# Patient Record
Sex: Female | Born: 1935
Health system: Southern US, Community
[De-identification: ages and names within clinical notes are randomized; demographics above are authoritative.]

## PROBLEM LIST (undated history)

## (undated) DIAGNOSIS — F0280 Dementia in other diseases classified elsewhere without behavioral disturbance: Secondary | ICD-10-CM

## (undated) DIAGNOSIS — F0390 Unspecified dementia without behavioral disturbance: Secondary | ICD-10-CM

## (undated) HISTORY — DX: Dementia in other diseases classified elsewhere, unspecified severity, without behavioral disturbance, psychotic disturbance, mood disturbance, and anxiety: F02.80

---

## 2012-06-15 ENCOUNTER — Encounter (HOSPITAL_COMMUNITY): Payer: Self-pay | Admitting: *Deleted

## 2012-06-15 ENCOUNTER — Emergency Department (HOSPITAL_COMMUNITY)
Admission: EM | Admit: 2012-06-15 | Discharge: 2012-06-15 | Disposition: A | Discharge status: Home / Self Care | Payer: Medicare Other | Attending: Emergency Medicine | Admitting: Emergency Medicine

## 2012-06-15 ENCOUNTER — Emergency Department (HOSPITAL_COMMUNITY): Payer: Medicare Other

## 2012-06-15 DIAGNOSIS — R4182 Altered mental status, unspecified: Secondary | ICD-10-CM | POA: Insufficient documentation

## 2012-06-15 DIAGNOSIS — F068 Other specified mental disorders due to known physiological condition: Secondary | ICD-10-CM | POA: Insufficient documentation

## 2012-06-15 DIAGNOSIS — R319 Hematuria, unspecified: Secondary | ICD-10-CM | POA: Insufficient documentation

## 2012-06-15 LAB — URINE MICROSCOPIC-ADD ON

## 2012-06-15 LAB — URINALYSIS, ROUTINE W REFLEX MICROSCOPIC
Bilirubin Urine: NEGATIVE
Specific Gravity, Urine: 1.02 (ref 1.005–1.030)
Urobilinogen, UA: 1 mg/dL (ref 0.0–1.0)

## 2012-06-15 LAB — COMPREHENSIVE METABOLIC PANEL
AST: 16 U/L (ref 0–37)
BUN: 15 mg/dL (ref 6–23)
CO2: 27 mEq/L (ref 19–32)
Calcium: 9.6 mg/dL (ref 8.4–10.5)
Creatinine, Ser: 1.05 mg/dL (ref 0.50–1.10)
GFR calc Af Amer: 59 mL/min — ABNORMAL LOW (ref >90)
GFR calc non Af Amer: 51 mL/min — ABNORMAL LOW (ref >90)
Glucose, Bld: 100 mg/dL — ABNORMAL HIGH (ref 70–99)

## 2012-06-15 LAB — CBC
MCH: 31.1 pg (ref 26.0–34.0)
MCV: 92.5 fL (ref 78.0–100.0)
Platelets: 248 10*3/uL (ref 150–400)
RBC: 4.56 MIL/uL (ref 3.87–5.11)

## 2012-06-15 NOTE — ED Notes (Signed)
Nuns cap given to caregiver to try to get urine sample from pt. nad

## 2012-06-15 NOTE — ED Notes (Signed)
POA brought pt in today, says she has not seen MD in 20 years. Anxious, dementia,  Husband in [facility].  Has been violent  At times. Calm at present.  They want something to "keep her calm"

## 2012-06-15 NOTE — ED Provider Notes (Signed)
History     CSN: [id number]  Arrival date & time 06/15/12  1452   First MD Initiated Contact with Patient 06/15/12 1536      Chief Complaint  Patient presents with  . Anxiety     HPI The patient presents with family members due to concerns of worsening dementia, increasingly agitated and violent behavior.  Notably, the patient's husband was recently hospitalized following a hip fracture and the patient is currently having her house extensively cleaned, do to her habit of hoarding. The patient denies any complaints and states that she has no physical illness, and defers interventions. Family member stated as the patient has expressed his recent stressful events she is become increasingly agitated, making threatening statements and lashing out at caregivers and family members.  History reviewed. No pertinent past medical history.  History reviewed. No pertinent past surgical history.  History reviewed. No pertinent family history.  History  Substance Use Topics  . Smoking status: Never Smoker   . Smokeless tobacco: Not on file  . Alcohol Use: No    OB History    Grav Para Term Preterm Abortions TAB SAB Ect Mult Living                  Review of Systems  Unable to perform ROS: Dementia    Allergies  Review of patient's allergies indicates no known allergies.  Home Medications   Current Outpatient Rx  Name Route Sig Dispense Refill  . ACETAMINOPHEN 325 MG PO TABS Oral Take 325 mg by mouth as needed. For occasional pain      BP 111/54  Pulse 103  Temp 98.8 F (37.1 C) (Oral)  Resp 20  Ht 5\' 2"  (1.575 m)  Wt 110 lb 11 oz (50.208 kg)  BMI 20.25 kg/m2  SpO2 100%  Physical Exam  Nursing note and vitals reviewed. Constitutional: She appears well-developed and well-nourished. No distress.  HENT:  Head: Normocephalic and atraumatic.  Eyes: Conjunctivae and EOM are normal.  Cardiovascular: Normal rate and regular rhythm.   Murmur heard. Pulmonary/Chest: Effort  normal and breath sounds normal. No stridor. No respiratory distress.  Abdominal: She exhibits no distension.  Musculoskeletal: She exhibits no edema.  Neurological: She is alert. No cranial nerve deficit.       Patient is oriented only to self, and grossly to place  Skin: Skin is warm and dry.  Psychiatric: She has a normal mood and affect. Her speech is normal and behavior is normal. Thought content is delusional. Cognition and memory are impaired. She exhibits abnormal recent memory and abnormal remote memory.    ED Course  Procedures (including critical care time)  Labs Reviewed  URINALYSIS, ROUTINE W REFLEX MICROSCOPIC - Abnormal; Notable for the following:    Hgb urine dipstick MODERATE (*)     All other components within normal limits  COMPREHENSIVE METABOLIC PANEL - Abnormal; Notable for the following:    Glucose, Bld 100 (*)     GFR calc non Af Amer 51 (*)     GFR calc Af Amer 59 (*)     All other components within normal limits  CBC  URINE MICROSCOPIC-ADD ON   Ct Head Wo Contrast  06/15/2012  *RADIOLOGY REPORT*  Clinical Data: Mental status change  CT HEAD WITHOUT CONTRAST  Technique:  Contiguous axial images were obtained from the base of the skull through the vertex without contrast.  Comparison: None.  Findings: Moderate atrophy.  Mild chronic microvascular ischemic changes.  No acute  infarct.  Negative for hemorrhage or mass.  No fluid collection.  Negative for skull lesion.  IMPRESSION: Atrophy and chronic microvascular ischemia.  No acute abnormality.  Original Report Authenticated By: Camelia Phenes, M.D.     1. Hematuria       MDM  This elderly female presents with family members due to concerns of increasingly agitated behavior and worsening dementia.  Given the description of new characteristics, the patient's inability to provide significant details, she had evaluation with labs, radiographic studies.  These were largely reassuring, though there is evidence of  hematuria.  The patient's family requested a medication for sedation, the patient defers all interventions, prescriptions.  I discussed the need for ongoing evaluation and the necessity of following up with a primary care physician to obtain her pending urine culture.  This will also facilitate further consideration of her dementia.  Absent notable physical exam findings, remarkably abnormal vital signs, the patient was appropriate for discharge, though with her family I discussed the need for close followup   Gerhard Munch, MD 06/15/12 1947

## 2012-06-17 LAB — URINE CULTURE

## 2012-08-23 ENCOUNTER — Emergency Department (HOSPITAL_COMMUNITY)
Admission: EM | Admit: 2012-08-23 | Discharge: 2012-08-23 | Disposition: A | Discharge status: Home / Self Care | Payer: Medicare Other | Attending: Emergency Medicine | Admitting: Emergency Medicine

## 2012-08-23 ENCOUNTER — Encounter (HOSPITAL_COMMUNITY): Payer: Self-pay

## 2012-08-23 DIAGNOSIS — F0390 Unspecified dementia without behavioral disturbance: Secondary | ICD-10-CM | POA: Insufficient documentation

## 2012-08-23 DIAGNOSIS — N390 Urinary tract infection, site not specified: Secondary | ICD-10-CM | POA: Insufficient documentation

## 2012-08-23 DIAGNOSIS — Z79899 Other long term (current) drug therapy: Secondary | ICD-10-CM | POA: Insufficient documentation

## 2012-08-23 HISTORY — DX: Unspecified dementia, unspecified severity, without behavioral disturbance, psychotic disturbance, mood disturbance, and anxiety: F03.90

## 2012-08-23 LAB — URINALYSIS, ROUTINE W REFLEX MICROSCOPIC
Glucose, UA: NEGATIVE mg/dL
Ketones, ur: NEGATIVE mg/dL
Nitrite: NEGATIVE
Specific Gravity, Urine: >1.03 — ABNORMAL HIGH (ref 1.005–1.030)
pH: 6 (ref 5.0–8.0)

## 2012-08-23 LAB — CBC WITH DIFFERENTIAL/PLATELET
Basophils Absolute: 0 10*3/uL (ref 0.0–0.1)
HCT: 45.9 % (ref 36.0–46.0)
Hemoglobin: 15.2 g/dL — ABNORMAL HIGH (ref 12.0–15.0)
Lymphocytes Relative: 16 % (ref 12–46)
Monocytes Absolute: 0.6 10*3/uL (ref 0.1–1.0)
Monocytes Relative: 8 % (ref 3–12)
Neutro Abs: 5.6 10*3/uL (ref 1.7–7.7)
Neutrophils Relative %: 74 % (ref 43–77)
RBC: 4.87 MIL/uL (ref 3.87–5.11)
WBC: 7.6 10*3/uL (ref 4.0–10.5)

## 2012-08-23 LAB — BASIC METABOLIC PANEL
BUN: 21 mg/dL (ref 6–23)
CO2: 29 mEq/L (ref 19–32)
Chloride: 105 mEq/L (ref 96–112)
Creatinine, Ser: 0.95 mg/dL (ref 0.50–1.10)
Potassium: 3.9 mEq/L (ref 3.5–5.1)

## 2012-08-23 LAB — URINE MICROSCOPIC-ADD ON

## 2012-08-23 MED ORDER — CEPHALEXIN 250 MG/5ML PO SUSR: 500.0000 mg | Freq: Once | ORAL | Status: DC

## 2012-08-23 MED ORDER — CEPHALEXIN 500 MG PO CAPS: 500.0000 mg | ORAL_CAPSULE | Freq: Four times a day (QID) | ORAL | Status: DC

## 2012-08-23 MED ORDER — CEPHALEXIN 500 MG PO CAPS
500.0000 mg | ORAL_CAPSULE | Freq: Once | ORAL | Status: AC
  Administered 2012-08-23: 500 mg via ORAL
  Filled 2012-08-23: qty 1

## 2012-08-23 MED ORDER — ONDANSETRON 4 MG PO TBDP
4.0000 mg | ORAL_TABLET | Freq: Once | ORAL | Status: AC
  Administered 2012-08-23: 4 mg via ORAL
  Filled 2012-08-23: qty 1

## 2012-08-23 NOTE — ED Notes (Signed)
Caregiver reports that pt has not been eating or drinking well for last 4 days. Afraid she may be getting dehydrated.

## 2012-08-23 NOTE — ED Notes (Signed)
EKG delayed due to dr. In room with patient.

## 2012-08-23 NOTE — ED Notes (Signed)
MD at bedside. Dr Bebe Shaggy at bedside discussing plan of care with caregiver and said pt.

## 2012-08-23 NOTE — ED Notes (Signed)
Pt given sprite and graham crackers. Pt tolerated both well. Care giver states the pt use to drink 4 pots of coffee a day and spouse recently (within past week) stopped all caffeine intake. Pt is quiet, and does not understand why she is here.

## 2012-08-23 NOTE — ED Provider Notes (Signed)
History   This chart was scribed for Joya Gaskins, MD by Charolett Bumpers . The patient was seen in room APA05/APA05. Patient's care was started at 1025.   CSN: [id number] Arrival date & time 08/23/12  1001  First MD Initiated Contact with Patient 08/23/12 1025      Chief Complaint  Patient presents with  . Failure To Thrive   Level 5 Caveat: Dementia The history is provided by the patient and a caregiver. No language interpreter was used.   Heather Cline is a 76 y.o. female who presents to the Emergency Department complaining of constant, moderate decreased appetite for the past 4 days. Caregiver denies any acute changes in mental status but states the pt has been more aggressive than usual. Caregiver denies any fevers, SOB or severe cough. Caregiver denies any recent falls or injuries. Pt denies any headache, vomiting, abdominal pain, chest pain, SOB. Pt has a h/o dementia and only takes Aricept. Caretaker states the pt is in the last stages of dementia. Hx is limited due to h/o dementia.    PCP: Dr. Margo Aye  Past Medical History  Diagnosis Date  . Dementia     History reviewed. No pertinent past surgical history.  No family history on file.  History  Substance Use Topics  . Smoking status: Never Smoker   . Smokeless tobacco: Not on file  . Alcohol Use: No    OB History    Grav Para Term Preterm Abortions TAB SAB Ect Mult Living                  Review of Systems  Unable to perform ROS: Dementia    Allergies  Review of patient's allergies indicates no known allergies.  Home Medications   Current Outpatient Rx  Name Route Sig Dispense Refill  . DONEPEZIL HCL 10 MG PO TABS Oral Take 10 mg by mouth at bedtime.    . CEPHALEXIN 500 MG PO CAPS Oral Take 1 capsule (500 mg total) by mouth 4 (four) times daily. 28 capsule 0    BP 113/40  Pulse 68  Temp 97.9 F (36.6 C) (Oral)  Resp 18  SpO2 98%  Physical Exam CONSTITUTIONAL: Well developed/well  nourished HEAD AND FACE: Normocephalic/atraumatic, no signs of trauma EYES: EOMI/PERRL ENMT: Mucous membranes moist NECK: supple no meningeal signs SPINE:entire spine nontender CV: S1/S2 noted, no murmurs/rubs/gallops noted LUNGS: Lungs are clear to auscultation bilaterally, no apparent distress ABDOMEN: soft, nontender, no rebound or guarding GU:no cva tenderness NEURO: Pt is awake/alert, moves all extremitiesx4, no arm or leg drift, gait normal, pt is awake and alert but unable to recall current date or time which is baseline.  EXTREMITIES: pulses normal, full ROM SKIN: warm, color normal PSYCH: no abnormalities of mood noted  ED Course  Procedures  DIAGNOSTIC STUDIES: Oxygen Saturation is 98% on room air, normal by my interpretation.    COORDINATION OF CARE:  10:43-Discussed planned course of treatment with the caregiver including nausea medication, blood work and UA, who is agreeable at this time.   10:45-Medication Orders: Ondansetron (Zofran-ODT) disintegrating tablet 4 mg-once.   12:05-Recheck: Pt appears to have a UTI. Will start on Keflex. Waiting for additional labs. Caregiver agreeable.   Labs Reviewed  CBC WITH DIFFERENTIAL - Abnormal; Notable for the following:    Hemoglobin 15.2 (*)     All other components within normal limits  BASIC METABOLIC PANEL - Abnormal; Notable for the following:    Glucose, Bld 103 (*)  GFR calc non Af Amer 58 (*)     GFR calc Af Amer 67 (*)     All other components within normal limits  URINALYSIS, ROUTINE W REFLEX MICROSCOPIC - Abnormal; Notable for the following:    Specific Gravity, Urine >1.030 (*)     Hgb urine dipstick LARGE (*)     Leukocytes, UA SMALL (*)     All other components within normal limits  URINE MICROSCOPIC-ADD ON - Abnormal; Notable for the following:    Bacteria, UA FEW (*)     All other components within normal limits  URINE CULTURE     MDM  Nursing notes including past medical history and social  history reviewed and considered in documentation Labs/vital reviewed and considered      Date: 08/23/2012  Rate: 79  Rhythm: normal sinus rhythm  QRS Axis: normal  Intervals: normal  ST/T Wave abnormalities: nonspecific ST changes  Conduction Disutrbances:none  Narrative Interpretation:   Old EKG Reviewed: none available at time of interpretation    I personally performed the services described in this documentation, which was scribed in my presence. The recorded information has been reviewed and considered.         Joya Gaskins, MD 08/23/12 1245

## 2012-08-23 NOTE — ED Notes (Signed)
Upon entering the room, I noticed a frail, calm and slightly withdrawn elderly lady sitting upright on the stretcher. A very tired, much younger caregiver at her bedside. When asked "what brings you here today" Pt stated she is eating and drinking as normal and does not understand why she was made to come here. Caregiver reports she has not been eating and drinking for the last 4 days. Pt does not appear dehydrated, oral mucus membranes are moist and pink. Pt 's history is to include dementia. NAD noted.

## 2012-08-24 LAB — URINE CULTURE: Colony Count: NO GROWTH

## 2015-12-04 DIAGNOSIS — E46 Unspecified protein-calorie malnutrition: Secondary | ICD-10-CM | POA: Diagnosis not present

## 2015-12-04 DIAGNOSIS — G309 Alzheimer's disease, unspecified: Secondary | ICD-10-CM | POA: Diagnosis not present

## 2015-12-04 DIAGNOSIS — R634 Abnormal weight loss: Secondary | ICD-10-CM | POA: Diagnosis not present

## 2015-12-25 DIAGNOSIS — R001 Bradycardia, unspecified: Secondary | ICD-10-CM | POA: Diagnosis not present

## 2015-12-25 DIAGNOSIS — F0280 Dementia in other diseases classified elsewhere without behavioral disturbance: Secondary | ICD-10-CM | POA: Diagnosis not present

## 2015-12-25 DIAGNOSIS — R634 Abnormal weight loss: Secondary | ICD-10-CM | POA: Diagnosis not present

## 2016-01-17 DIAGNOSIS — G309 Alzheimer's disease, unspecified: Secondary | ICD-10-CM | POA: Diagnosis not present

## 2016-01-29 DIAGNOSIS — I499 Cardiac arrhythmia, unspecified: Secondary | ICD-10-CM | POA: Diagnosis not present

## 2016-03-06 DIAGNOSIS — I739 Peripheral vascular disease, unspecified: Secondary | ICD-10-CM | POA: Diagnosis not present

## 2016-03-06 DIAGNOSIS — B351 Tinea unguium: Secondary | ICD-10-CM | POA: Diagnosis not present

## 2016-05-14 DIAGNOSIS — L0291 Cutaneous abscess, unspecified: Secondary | ICD-10-CM | POA: Diagnosis not present

## 2016-05-14 DIAGNOSIS — L0390 Cellulitis, unspecified: Secondary | ICD-10-CM | POA: Diagnosis not present

## 2016-11-12 DIAGNOSIS — R197 Diarrhea, unspecified: Secondary | ICD-10-CM | POA: Diagnosis not present

## 2016-11-12 DIAGNOSIS — R300 Dysuria: Secondary | ICD-10-CM | POA: Diagnosis not present

## 2016-11-13 DIAGNOSIS — N390 Urinary tract infection, site not specified: Secondary | ICD-10-CM | POA: Diagnosis not present

## 2017-04-17 ENCOUNTER — Encounter (HOSPITAL_COMMUNITY): Payer: Self-pay

## 2017-04-17 ENCOUNTER — Inpatient Hospital Stay (HOSPITAL_COMMUNITY)
Admission: EM | Admit: 2017-04-17 | Discharge: 2017-04-23 | DRG: 482 | Disposition: A | Discharge status: Home Health | Payer: Medicare Other | Attending: Internal Medicine | Admitting: Internal Medicine

## 2017-04-17 ENCOUNTER — Emergency Department (HOSPITAL_COMMUNITY): Payer: Medicare Other

## 2017-04-17 DIAGNOSIS — I480 Paroxysmal atrial fibrillation: Secondary | ICD-10-CM | POA: Diagnosis not present

## 2017-04-17 DIAGNOSIS — S299XXA Unspecified injury of thorax, initial encounter: Secondary | ICD-10-CM | POA: Diagnosis not present

## 2017-04-17 DIAGNOSIS — D638 Anemia in other chronic diseases classified elsewhere: Secondary | ICD-10-CM | POA: Diagnosis not present

## 2017-04-17 DIAGNOSIS — Z79899 Other long term (current) drug therapy: Secondary | ICD-10-CM | POA: Diagnosis not present

## 2017-04-17 DIAGNOSIS — E876 Hypokalemia: Secondary | ICD-10-CM

## 2017-04-17 DIAGNOSIS — Z23 Encounter for immunization: Secondary | ICD-10-CM | POA: Diagnosis not present

## 2017-04-17 DIAGNOSIS — W19XXXA Unspecified fall, initial encounter: Secondary | ICD-10-CM | POA: Diagnosis not present

## 2017-04-17 DIAGNOSIS — S72142D Displaced intertrochanteric fracture of left femur, subsequent encounter for closed fracture with routine healing: Secondary | ICD-10-CM | POA: Diagnosis not present

## 2017-04-17 DIAGNOSIS — K5641 Fecal impaction: Secondary | ICD-10-CM | POA: Diagnosis not present

## 2017-04-17 DIAGNOSIS — R197 Diarrhea, unspecified: Secondary | ICD-10-CM | POA: Diagnosis not present

## 2017-04-17 DIAGNOSIS — Z043 Encounter for examination and observation following other accident: Secondary | ICD-10-CM | POA: Diagnosis not present

## 2017-04-17 DIAGNOSIS — S72009A Fracture of unspecified part of neck of unspecified femur, initial encounter for closed fracture: Secondary | ICD-10-CM | POA: Diagnosis present

## 2017-04-17 DIAGNOSIS — N390 Urinary tract infection, site not specified: Secondary | ICD-10-CM | POA: Diagnosis not present

## 2017-04-17 DIAGNOSIS — N183 Chronic kidney disease, stage 3 unspecified: Secondary | ICD-10-CM | POA: Diagnosis present

## 2017-04-17 DIAGNOSIS — F0280 Dementia in other diseases classified elsewhere without behavioral disturbance: Secondary | ICD-10-CM | POA: Diagnosis present

## 2017-04-17 DIAGNOSIS — R109 Unspecified abdominal pain: Secondary | ICD-10-CM | POA: Diagnosis not present

## 2017-04-17 DIAGNOSIS — F03C0 Unspecified dementia, severe, without behavioral disturbance, psychotic disturbance, mood disturbance, and anxiety: Secondary | ICD-10-CM | POA: Diagnosis present

## 2017-04-17 DIAGNOSIS — S72142A Displaced intertrochanteric fracture of left femur, initial encounter for closed fracture: Secondary | ICD-10-CM | POA: Diagnosis not present

## 2017-04-17 DIAGNOSIS — J9811 Atelectasis: Secondary | ICD-10-CM | POA: Diagnosis not present

## 2017-04-17 DIAGNOSIS — M25552 Pain in left hip: Secondary | ICD-10-CM | POA: Diagnosis not present

## 2017-04-17 DIAGNOSIS — F0390 Unspecified dementia without behavioral disturbance: Secondary | ICD-10-CM | POA: Diagnosis not present

## 2017-04-17 DIAGNOSIS — Y92009 Unspecified place in unspecified non-institutional (private) residence as the place of occurrence of the external cause: Secondary | ICD-10-CM | POA: Diagnosis not present

## 2017-04-17 DIAGNOSIS — S72002A Fracture of unspecified part of neck of left femur, initial encounter for closed fracture: Secondary | ICD-10-CM

## 2017-04-17 DIAGNOSIS — W1830XA Fall on same level, unspecified, initial encounter: Secondary | ICD-10-CM | POA: Diagnosis not present

## 2017-04-17 DIAGNOSIS — R509 Fever, unspecified: Secondary | ICD-10-CM

## 2017-04-17 DIAGNOSIS — T148XXA Other injury of unspecified body region, initial encounter: Secondary | ICD-10-CM | POA: Diagnosis not present

## 2017-04-17 DIAGNOSIS — R279 Unspecified lack of coordination: Secondary | ICD-10-CM | POA: Diagnosis not present

## 2017-04-17 DIAGNOSIS — K5900 Constipation, unspecified: Secondary | ICD-10-CM | POA: Diagnosis not present

## 2017-04-17 DIAGNOSIS — D72829 Elevated white blood cell count, unspecified: Secondary | ICD-10-CM | POA: Diagnosis not present

## 2017-04-17 DIAGNOSIS — Z66 Do not resuscitate: Secondary | ICD-10-CM | POA: Diagnosis not present

## 2017-04-17 DIAGNOSIS — R8281 Pyuria: Secondary | ICD-10-CM | POA: Diagnosis present

## 2017-04-17 DIAGNOSIS — I491 Atrial premature depolarization: Secondary | ICD-10-CM | POA: Diagnosis not present

## 2017-04-17 DIAGNOSIS — R404 Transient alteration of awareness: Secondary | ICD-10-CM | POA: Diagnosis not present

## 2017-04-17 DIAGNOSIS — Z7401 Bed confinement status: Secondary | ICD-10-CM | POA: Diagnosis not present

## 2017-04-17 LAB — BASIC METABOLIC PANEL
Anion gap: 9 (ref 5–15)
BUN: 23 mg/dL — AB (ref 6–20)
CALCIUM: 9.4 mg/dL (ref 8.9–10.3)
CO2: 26 mmol/L (ref 22–32)
CREATININE: 1.28 mg/dL — AB (ref 0.44–1.00)
Chloride: 107 mmol/L (ref 101–111)
GFR calc Af Amer: 45 mL/min — ABNORMAL LOW (ref >60)
GFR, EST NON AFRICAN AMERICAN: 39 mL/min — AB (ref >60)
Glucose, Bld: 133 mg/dL — ABNORMAL HIGH (ref 65–99)
Potassium: 3.8 mmol/L (ref 3.5–5.1)
Sodium: 142 mmol/L (ref 135–145)

## 2017-04-17 LAB — CBC WITH DIFFERENTIAL/PLATELET
BASOS ABS: 0 10*3/uL (ref 0.0–0.1)
BASOS PCT: 0 %
EOS ABS: 0.1 10*3/uL (ref 0.0–0.7)
EOS PCT: 0 %
HCT: 44.7 % (ref 36.0–46.0)
Hemoglobin: 14.7 g/dL (ref 12.0–15.0)
Lymphocytes Relative: 16 %
Lymphs Abs: 1.8 10*3/uL (ref 0.7–4.0)
MCH: 30.7 pg (ref 26.0–34.0)
MCHC: 32.9 g/dL (ref 30.0–36.0)
MCV: 93.3 fL (ref 78.0–100.0)
Monocytes Absolute: 0.8 10*3/uL (ref 0.1–1.0)
Monocytes Relative: 7 %
Neutro Abs: 8.8 10*3/uL — ABNORMAL HIGH (ref 1.7–7.7)
Neutrophils Relative %: 77 %
PLATELETS: 294 10*3/uL (ref 150–400)
RBC: 4.79 MIL/uL (ref 3.87–5.11)
RDW: 13.5 % (ref 11.5–15.5)
WBC: 11.4 10*3/uL — AB (ref 4.0–10.5)

## 2017-04-17 LAB — ABO/RH: ABO/RH(D): A POS

## 2017-04-17 LAB — URINALYSIS, ROUTINE W REFLEX MICROSCOPIC
Bilirubin Urine: NEGATIVE
GLUCOSE, UA: NEGATIVE mg/dL
Hgb urine dipstick: NEGATIVE
Ketones, ur: NEGATIVE mg/dL
Nitrite: NEGATIVE
PROTEIN: NEGATIVE mg/dL
Specific Gravity, Urine: 1.019 (ref 1.005–1.030)
pH: 7 (ref 5.0–8.0)

## 2017-04-17 LAB — PROTIME-INR
INR: 1
PROTHROMBIN TIME: 13.2 s (ref 11.4–15.2)

## 2017-04-17 LAB — PREPARE RBC (CROSSMATCH)

## 2017-04-17 MED ORDER — SODIUM CHLORIDE 0.9 % IV SOLN: INTRAVENOUS | Status: DC

## 2017-04-17 MED ORDER — POVIDONE-IODINE 10 % EX SWAB: 2.0000 "application " | Freq: Once | CUTANEOUS | Status: DC

## 2017-04-17 MED ORDER — SODIUM CHLORIDE 0.9 % IV SOLN
Freq: Once | INTRAVENOUS | Status: AC
  Administered 2017-04-18: 12:00:00 via INTRAVENOUS

## 2017-04-17 MED ORDER — SODIUM CHLORIDE 0.45 % IV SOLN
INTRAVENOUS | Status: DC
  Administered 2017-04-18 – 2017-04-22 (×9): via INTRAVENOUS

## 2017-04-17 MED ORDER — CEFAZOLIN SODIUM-DEXTROSE 2-4 GM/100ML-% IV SOLN
2.0000 g | INTRAVENOUS | Status: AC
  Administered 2017-04-18: 2 g via INTRAVENOUS
  Filled 2017-04-17 (×2): qty 100

## 2017-04-17 MED ORDER — CHLORHEXIDINE GLUCONATE 4 % EX LIQD
60.0000 mL | Freq: Once | CUTANEOUS | Status: AC
  Administered 2017-04-18: 4 via TOPICAL
  Filled 2017-04-17: qty 60

## 2017-04-17 MED ORDER — DEXTROSE 5 % IV SOLN
1.0000 g | INTRAVENOUS | Status: DC
  Administered 2017-04-18: 1 g via INTRAVENOUS
  Filled 2017-04-17 (×4): qty 10

## 2017-04-17 MED ORDER — MAGNESIUM HYDROXIDE 400 MG/5ML PO SUSP: 30.0000 mL | Freq: Every day | ORAL | Status: DC | PRN

## 2017-04-17 MED ORDER — DONEPEZIL HCL 5 MG PO TABS
10.0000 mg | ORAL_TABLET | Freq: Every day | ORAL | Status: DC
  Administered 2017-04-18 – 2017-04-21 (×4): 10 mg via ORAL
  Filled 2017-04-17 (×5): qty 2

## 2017-04-17 MED ORDER — ENOXAPARIN SODIUM 40 MG/0.4ML ~~LOC~~ SOLN: 40.0000 mg | SUBCUTANEOUS | Status: DC

## 2017-04-17 MED ORDER — MORPHINE SULFATE (PF) 2 MG/ML IV SOLN
1.0000 mg | INTRAVENOUS | Status: DC | PRN
  Administered 2017-04-18 – 2017-04-19 (×3): 1 mg via INTRAVENOUS
  Filled 2017-04-17 (×3): qty 1

## 2017-04-17 MED ORDER — DOCUSATE SODIUM 100 MG PO CAPS
100.0000 mg | ORAL_CAPSULE | Freq: Two times a day (BID) | ORAL | Status: DC
  Administered 2017-04-18 – 2017-04-21 (×4): 100 mg via ORAL
  Filled 2017-04-17 (×6): qty 1

## 2017-04-17 MED ORDER — HYDROCODONE-ACETAMINOPHEN 5-325 MG PO TABS
1.0000 | ORAL_TABLET | Freq: Four times a day (QID) | ORAL | Status: DC | PRN
  Administered 2017-04-18 – 2017-04-19 (×2): 1 via ORAL
  Filled 2017-04-17 (×2): qty 1

## 2017-04-17 MED ORDER — BISACODYL 10 MG RE SUPP: 10.0000 mg | Freq: Every day | RECTAL | Status: DC | PRN

## 2017-04-17 NOTE — ED Notes (Signed)
MD at bedside. 

## 2017-04-17 NOTE — ED Notes (Signed)
Patient transported to CT 

## 2017-04-17 NOTE — ED Triage Notes (Addendum)
Pt brought in by [facility] for c/o fall; family witnessed pt fall on her left side; family assisted pt into wheelchair;  pt has a hx of alzheimers and is unable to verbalize her pain; pt has shortening and rotation to left hip; pt given morphine 4mg  IV en route to hospital

## 2017-04-17 NOTE — H&P (Addendum)
[facility] History and Physical  AHMIRA BOISSELLE YYQ:[id number] DOB: 1936/05/09 DOA: 04/17/2017  Referring physician: Dr Vanita Panda PCP: Celene Squibb, MD   Chief Complaint: Lytle Michaels / L hip fracture  HPI: Heather Cline is a 81 y.o. female with history of severe dementia but no other medical conditions, takes only Aricept.  Pt fell at home today , in ED xrays show L intertrochanteric fracture of the left femur.  ED spoke with ortho and they plan on doing surgery here tomorrow.  We are asked to admit pt.    Hx per husband and pt's sister.  She has had progressive severe dementia > 5 yrs now.  Lives with husband, requires assistance with all ADL's, will not eat much unless attended to.  Is fully ambulatory around the house, but does have a tendency to wander.  No cane/ walker or WC needed.    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  No etoh / tob. They have one son in the [employer] and one daughter.    ROS  not available, pt not responding, severe dementia   Past Medical History  Past Medical History:  Diagnosis Date  . Dementia    Past Surgical History No past surgical history on file. Family History No family history on file. Social History  reports that she has never smoked. She does not have any smokeless tobacco history on file. She reports that she does not drink alcohol or use drugs. Allergies No Known Allergies Home medications Prior to Admission medications   Medication Sig Start Date End Date Taking? Authorizing Provider  donepezil (ARICEPT) 10 MG tablet Take 10 mg by mouth at bedtime.   Yes [provider]   Liver Function Tests No results for input(s): AST, ALT, ALKPHOS, BILITOT, PROT, ALBUMIN in the last 168 hours. No results for input(s): LIPASE, AMYLASE in the last 168 hours. CBC  Recent Labs Lab 04/17/17 2037  WBC 11.4*  NEUTROABS 8.8*  HGB 14.7  HCT 44.7  MCV 93.3  PLT 888   Basic Metabolic  Panel  Recent Labs Lab 04/17/17 2041  NA 142  K 3.8  CL 107  CO2 26  GLUCOSE 133*  BUN 23*  CREATININE 1.28*  CALCIUM 9.4     Vitals:   04/17/17 2012 04/17/17 2100 04/17/17 2130  BP: (!) 152/67 (!) 159/68 (!) 145/67  Pulse: 60 (!) 56 (!) 50  Resp: _0 Temp: 98.7 F (37.1 C)    TempSrc: Oral    SpO2: 100% 100% 99%   Exam: Gen elderly , quiet WF, pleasant, confused  No rash, cyanosis or gangrene Sclera anicteric, throat clear  No jvd or bruits Chest clear bilat RRR no MRG Abd soft ntnd no mass or ascites +bs, small periumb hernia GU foley in place, clear urine MS no joint effusion, L leg shortened and ext rotated Ext no LE edema / no wounds or ulcers Neuro is groggy, awakens easily, doesn't follow commands, not oriented    Na 142  K 3.8  BUN 23  Cr 1.28  eGFR  40   Ca 9.4 WBC 11  Hb 14  UA cloudy, few bact, TNTC wbc, 6-30 RBC's, 0-5 epi's.   CXR (independ reviewed) > no acute disease   Assessment: 1  Mechanical fall/  L hip fracture - admit, NPO after MN, ortho aware and plan to see pt in am 2  Pyuria - IV rocephin and urine  cx 3  Severe dementia - cont Aricept 4  CKD stage 3 5  Fecal impaction - picked up on xrays of hip  Plan - admit, NPO after MN, IVF's, MSO4 prn, bed alarms due to dementia, stool softeners and prn laxatives ordered for post-op     Ketura Sirek D [facility] Pager [phone]   If 7PM-7AM, please contact night-coverage [email or web address] Password [email or web address] 04/17/2017, 10:14 PM

## 2017-04-17 NOTE — ED Provider Notes (Signed)
[facility]-EMERGENCY DEPT Provider Note   CSN: [id number] Arrival date & time: 04/17/17  2009     History   Chief Complaint Chief Complaint  Patient presents with  . Fall    HPI Sudie Baileyhyllis T Colmenares is a 81 y.o. female.  HPI This elderly female with dementia presents from home after a fall. Falls witnessed by family members. The patient has dementia severe enough that she does not answer questions consistently, mumbles majority of the time, and is incapable of providing details of the history of present illness,   level V caveat.  Per report the patient was in her usual state of health, when she had a witnessed mechanical fall onto her left side. Since that time she has been nonambulatory.     Past Medical History:  Diagnosis Date  . Dementia     There are no active problems to display for this patient.   No past surgical history on file.  OB History    No data available       Home Medications    Prior to Admission medications   Medication Sig Start Date End Date Taking? Authorizing Provider  cephALEXin (KEFLEX) 500 MG capsule Take 1 capsule (500 mg total) by mouth 4 (four) times daily. 08/23/12   Zadie RhineWickline, Donald, MD  donepezil (ARICEPT) 10 MG tablet Take 10 mg by mouth at bedtime.    [provider]    Family History No family history on file.  Social History Social History  Substance Use Topics  . Smoking status: Never Smoker  . Smokeless tobacco: Not on file  . Alcohol use No     Allergies   Patient has no known allergies.   Review of Systems Review of Systems  Unable to perform ROS: Dementia     Physical Exam Updated Vital Signs BP (!) 152/67 (BP Location: Right Arm)   Pulse 60   Temp 98.7 F (37.1 C) (Oral)   Resp 18   SpO2 100%   Physical Exam  Constitutional: No distress.  Frail appearing elderly female offering inconsistent verbal responses  HENT:  Head: Normocephalic and atraumatic.  Eyes: Conjunctivae and EOM are  normal.  Cardiovascular: Normal rate and regular rhythm.   Pulmonary/Chest: Effort normal and breath sounds normal. No stridor. No respiratory distress.  Abdominal: She exhibits no distension.  Musculoskeletal: She exhibits no edema.       Left hip: She exhibits deformity.       Legs: Neurological: She is alert. She is disoriented. She displays atrophy. No cranial nerve deficit. She displays no seizure activity.  Skin: Skin is warm and dry.  Psychiatric: Cognition and memory are impaired.  Nursing note and vitals reviewed.    ED Treatments / Results  Labs (all labs ordered are listed, but only abnormal results are displayed) Labs Reviewed  BASIC METABOLIC PANEL - Abnormal; Notable for the following:       Result Value   Glucose, Bld 133 (*)    BUN 23 (*)    Creatinine, Ser 1.28 (*)    GFR calc non Af Amer 39 (*)    GFR calc Af Amer 45 (*)    All other components within normal limits  CBC WITH DIFFERENTIAL/PLATELET - Abnormal; Notable for the following:    WBC 11.4 (*)    Neutro Abs 8.8 (*)    All other components within normal limits  PROTIME-INR  URINALYSIS, ROUTINE W REFLEX MICROSCOPIC  TYPE AND SCREEN     Radiology I reviewed  the radiographic images, including left femoral neck hip fracture. Chest x-ray unremarkable to my interpretation.  Procedures Procedures (including critical care time)  Medications Ordered in ED Medications - No data to display   Initial Impression / Assessment and Plan / ED Course  I have reviewed the triage vital signs and the nursing notes.  Pertinent labs & imaging results that were available during my care of the patient were reviewed by me and considered in my medical decision making (see chart for details).  Update: Family members now arrived, they stated that the patient had a witnessed fall onto her left side, has been nonambulatory since the event. They confirm that the patient is otherwise generally well, has the unfortunate  paradox of being in generally good health aside from dementia. He states that the last time the patient was hospitalized was during childbirth. Patient is almost 81 years old. On repeat exam the patient remains in similar condition, pleasant, though demented. I reviewed the x-rays, labs, discussed the patient's case with our orthopedist on call, Dr. Romeo Apple. With concern for new hip fracture, the patient will be admitted to the hospitalist team.  Final Clinical Impressions(s) / ED Diagnoses  Fall, initial encounter Left hip fracture, initial encounter   Gerhard Munch, MD 04/17/17 2128

## 2017-04-18 ENCOUNTER — Inpatient Hospital Stay (HOSPITAL_COMMUNITY): Payer: Medicare Other

## 2017-04-18 ENCOUNTER — Encounter (HOSPITAL_COMMUNITY)
Admission: EM | Disposition: A | Discharge status: Home Health | Payer: Self-pay | Source: Home / Self Care | Attending: Internal Medicine

## 2017-04-18 ENCOUNTER — Inpatient Hospital Stay (HOSPITAL_COMMUNITY): Payer: Medicare Other | Admitting: Anesthesiology

## 2017-04-18 ENCOUNTER — Encounter (HOSPITAL_COMMUNITY): Payer: Self-pay | Admitting: Anesthesiology

## 2017-04-18 DIAGNOSIS — S72142A Displaced intertrochanteric fracture of left femur, initial encounter for closed fracture: Principal | ICD-10-CM

## 2017-04-18 HISTORY — PX: INTRAMEDULLARY (IM) NAIL INTERTROCHANTERIC: SHX5875

## 2017-04-18 LAB — CBC
HEMATOCRIT: 44 % (ref 36.0–46.0)
Hemoglobin: 14.5 g/dL (ref 12.0–15.0)
MCH: 30.7 pg (ref 26.0–34.0)
MCHC: 33 g/dL (ref 30.0–36.0)
MCV: 93 fL (ref 78.0–100.0)
Platelets: 269 10*3/uL (ref 150–400)
RBC: 4.73 MIL/uL (ref 3.87–5.11)
RDW: 13.3 % (ref 11.5–15.5)
WBC: 14.9 10*3/uL — AB (ref 4.0–10.5)

## 2017-04-18 LAB — SURGICAL PCR SCREEN
MRSA, PCR: POSITIVE — AB
Staphylococcus aureus: POSITIVE — AB

## 2017-04-18 LAB — BASIC METABOLIC PANEL
ANION GAP: 9 (ref 5–15)
BUN: 20 mg/dL (ref 6–20)
CHLORIDE: 106 mmol/L (ref 101–111)
CO2: 24 mmol/L (ref 22–32)
Calcium: 8.9 mg/dL (ref 8.9–10.3)
Creatinine, Ser: 0.96 mg/dL (ref 0.44–1.00)
GFR calc Af Amer: >60 mL/min (ref >60)
GFR calc non Af Amer: 55 mL/min — ABNORMAL LOW (ref >60)
GLUCOSE: 126 mg/dL — AB (ref 65–99)
POTASSIUM: 4 mmol/L (ref 3.5–5.1)
Sodium: 139 mmol/L (ref 135–145)

## 2017-04-18 SURGERY — FIXATION, FRACTURE, INTERTROCHANTERIC, WITH INTRAMEDULLARY ROD
Anesthesia: Spinal | Laterality: Left

## 2017-04-18 MED ORDER — LORAZEPAM 2 MG/ML IJ SOLN
0.5000 mg | INTRAMUSCULAR | Status: DC | PRN
  Administered 2017-04-18 – 2017-04-20 (×2): 1 mg via INTRAVENOUS
  Filled 2017-04-18 (×4): qty 1

## 2017-04-18 MED ORDER — BUPIVACAINE-EPINEPHRINE (PF) 0.5% -1:200000 IJ SOLN
INTRAMUSCULAR | Status: AC
  Filled 2017-04-18: qty 30

## 2017-04-18 MED ORDER — FENTANYL CITRATE (PF) 100 MCG/2ML IJ SOLN
INTRAMUSCULAR | Status: DC | PRN
  Administered 2017-04-18 (×2): 25 ug via INTRAVENOUS
  Administered 2017-04-18: 25 ug via INTRATHECAL
  Administered 2017-04-18: 25 ug via INTRAVENOUS

## 2017-04-18 MED ORDER — ENSURE ENLIVE PO LIQD
237.0000 mL | Freq: Two times a day (BID) | ORAL | Status: DC
  Administered 2017-04-20 – 2017-04-23 (×6): 237 mL via ORAL

## 2017-04-18 MED ORDER — MIDAZOLAM HCL 2 MG/2ML IJ SOLN
1.0000 mg | INTRAMUSCULAR | Status: DC
  Administered 2017-04-18: 1 mg via INTRAVENOUS

## 2017-04-18 MED ORDER — ACETAMINOPHEN 325 MG PO TABS: 650.0000 mg | ORAL_TABLET | Freq: Four times a day (QID) | ORAL | Status: DC | PRN

## 2017-04-18 MED ORDER — MIDAZOLAM HCL 2 MG/2ML IJ SOLN
INTRAMUSCULAR | Status: AC
  Filled 2017-04-18: qty 2

## 2017-04-18 MED ORDER — CHLORHEXIDINE GLUCONATE CLOTH 2 % EX PADS
6.0000 | MEDICATED_PAD | Freq: Every day | CUTANEOUS | Status: AC
  Administered 2017-04-19 – 2017-04-23 (×5): 6 via TOPICAL

## 2017-04-18 MED ORDER — MEPERIDINE HCL 50 MG/ML IJ SOLN
6.2500 mg | INTRAMUSCULAR | Status: DC | PRN
  Administered 2017-04-18: 6.25 mg via INTRAVENOUS

## 2017-04-18 MED ORDER — DEXTROSE 5 % IV SOLN
1.0000 g | INTRAVENOUS | Status: DC
  Administered 2017-04-19: 1 g via INTRAVENOUS
  Filled 2017-04-18 (×3): qty 10

## 2017-04-18 MED ORDER — BUPIVACAINE IN DEXTROSE 0.75-8.25 % IT SOLN
INTRATHECAL | Status: DC | PRN
  Administered 2017-04-18: 13 mg via INTRATHECAL

## 2017-04-18 MED ORDER — FENTANYL CITRATE (PF) 100 MCG/2ML IJ SOLN: 25.0000 ug | INTRAMUSCULAR | Status: DC | PRN

## 2017-04-18 MED ORDER — MIDAZOLAM HCL 5 MG/5ML IJ SOLN
INTRAMUSCULAR | Status: DC | PRN
  Administered 2017-04-18 (×2): 1 mg via INTRAVENOUS

## 2017-04-18 MED ORDER — MENTHOL 3 MG MT LOZG: 1.0000 | LOZENGE | OROMUCOSAL | Status: DC | PRN

## 2017-04-18 MED ORDER — LACTATED RINGERS IV SOLN: INTRAVENOUS | Status: DC

## 2017-04-18 MED ORDER — MAGNESIUM CITRATE PO SOLN
1.0000 | Freq: Once | ORAL | Status: AC
  Administered 2017-04-19: 1 via ORAL
  Filled 2017-04-18: qty 296

## 2017-04-18 MED ORDER — FENTANYL CITRATE (PF) 100 MCG/2ML IJ SOLN: 25.0000 ug | Freq: Once | INTRAMUSCULAR | Status: DC

## 2017-04-18 MED ORDER — PROPOFOL 500 MG/50ML IV EMUL
INTRAVENOUS | Status: DC | PRN
  Administered 2017-04-18: 50 ug/kg/min via INTRAVENOUS

## 2017-04-18 MED ORDER — ACETAMINOPHEN 650 MG RE SUPP: 650.0000 mg | Freq: Four times a day (QID) | RECTAL | Status: DC | PRN

## 2017-04-18 MED ORDER — CEFTRIAXONE SODIUM 1 G IJ SOLR
INTRAMUSCULAR | Status: AC
  Filled 2017-04-18: qty 10

## 2017-04-18 MED ORDER — MUPIROCIN 2 % EX OINT
1.0000 "application " | TOPICAL_OINTMENT | Freq: Two times a day (BID) | CUTANEOUS | Status: AC
  Administered 2017-04-18 – 2017-04-22 (×10): 1 via NASAL
  Filled 2017-04-18 (×2): qty 22

## 2017-04-18 MED ORDER — METOCLOPRAMIDE HCL 10 MG PO TABS: 5.0000 mg | ORAL_TABLET | Freq: Three times a day (TID) | ORAL | Status: DC | PRN

## 2017-04-18 MED ORDER — BUPIVACAINE-EPINEPHRINE (PF) 0.5% -1:200000 IJ SOLN
INTRAMUSCULAR | Status: AC
Start: 2017-04-18 — End: 2017-04-18
  Filled 2017-04-18: qty 30

## 2017-04-18 MED ORDER — PNEUMOCOCCAL VAC POLYVALENT 25 MCG/0.5ML IJ INJ
0.5000 mL | INJECTION | INTRAMUSCULAR | Status: AC
  Administered 2017-04-20: 0.5 mL via INTRAMUSCULAR
  Filled 2017-04-18: qty 0.5

## 2017-04-18 MED ORDER — SODIUM CHLORIDE 0.9 % IR SOLN
Status: DC | PRN
  Administered 2017-04-18: 1000 mL

## 2017-04-18 MED ORDER — MEPERIDINE HCL 50 MG/ML IJ SOLN
INTRAMUSCULAR | Status: AC
  Filled 2017-04-18: qty 1

## 2017-04-18 MED ORDER — BUPIVACAINE-EPINEPHRINE (PF) 0.5% -1:200000 IJ SOLN
INTRAMUSCULAR | Status: DC | PRN
  Administered 2017-04-18: 60 mL

## 2017-04-18 MED ORDER — METOCLOPRAMIDE HCL 5 MG/ML IJ SOLN: 5.0000 mg | Freq: Three times a day (TID) | INTRAMUSCULAR | Status: DC | PRN

## 2017-04-18 MED ORDER — ADULT MULTIVITAMIN W/MINERALS CH
1.0000 | ORAL_TABLET | Freq: Every day | ORAL | Status: DC
  Administered 2017-04-19 – 2017-04-23 (×5): 1 via ORAL
  Filled 2017-04-18 (×5): qty 1

## 2017-04-18 MED ORDER — ASPIRIN EC 325 MG PO TBEC
325.0000 mg | DELAYED_RELEASE_TABLET | Freq: Every day | ORAL | Status: DC
  Administered 2017-04-19 – 2017-04-23 (×5): 325 mg via ORAL
  Filled 2017-04-18 (×5): qty 1

## 2017-04-18 MED ORDER — PHENOL 1.4 % MT LIQD: 1.0000 | OROMUCOSAL | Status: DC | PRN

## 2017-04-18 SURGICAL SUPPLY — 55 items
BAG HAMPER (MISCELLANEOUS) ×3 IMPLANT
BIT DRILL AO GAMMA 4.2X300 (BIT) ×3 IMPLANT
BLADE HEX COATED 2.75 (ELECTRODE) ×3 IMPLANT
BLADE SURG SZ10 CARB STEEL (BLADE) ×6 IMPLANT
BNDG GAUZE ELAST 4 BULKY (GAUZE/BANDAGES/DRESSINGS) ×3 IMPLANT
CHLORAPREP W/TINT 26ML (MISCELLANEOUS) ×3 IMPLANT
CLOTH BEACON ORANGE TIMEOUT ST (SAFETY) ×3 IMPLANT
COVER LIGHT HANDLE STERIS (MISCELLANEOUS) ×6 IMPLANT
COVER MAYO STAND XLG (DRAPE) ×3 IMPLANT
DECANTER SPIKE VIAL GLASS SM (MISCELLANEOUS) ×6 IMPLANT
DRAPE STERI IOBAN 125X83 (DRAPES) ×3 IMPLANT
DRSG MEPILEX BORDER 4X12 (GAUZE/BANDAGES/DRESSINGS) ×3 IMPLANT
DRSG PAD ABDOMINAL 8X10 ST (GAUZE/BANDAGES/DRESSINGS) ×3 IMPLANT
ELECT REM PT RETURN 9FT ADLT (ELECTROSURGICAL) ×3
ELECTRODE REM PT RTRN 9FT ADLT (ELECTROSURGICAL) ×1 IMPLANT
GLOVE BIOGEL PI IND STRL 7.0 (GLOVE) ×2 IMPLANT
GLOVE BIOGEL PI INDICATOR 7.0 (GLOVE) ×4
GLOVE SKINSENSE NS SZ8.0 LF (GLOVE) ×2
GLOVE SKINSENSE STRL SZ8.0 LF (GLOVE) ×1 IMPLANT
GLOVE SS N UNI LF 8.5 STRL (GLOVE) ×3 IMPLANT
GOWN STRL REUS W/ TWL LRG LVL3 (GOWN DISPOSABLE) ×1 IMPLANT
GOWN STRL REUS W/TWL LRG LVL3 (GOWN DISPOSABLE) ×5 IMPLANT
GOWN STRL REUS W/TWL XL LVL3 (GOWN DISPOSABLE) ×3 IMPLANT
GUIDEROD T2 3X1000 (ROD) ×3 IMPLANT
INST SET MAJOR BONE (KITS) ×3 IMPLANT
K-WIRE  3.2X450M STR (WIRE) ×2
K-WIRE 3.2X450M STR (WIRE) ×1
KIT BLADEGUARD II DBL (SET/KITS/TRAYS/PACK) ×3 IMPLANT
KIT ROOM TURNOVER AP CYSTO (KITS) ×3 IMPLANT
KWIRE 3.2X450M STR (WIRE) ×1 IMPLANT
MANIFOLD NEPTUNE II (INSTRUMENTS) ×3 IMPLANT
MARKER SKIN DUAL TIP RULER LAB (MISCELLANEOUS) ×3 IMPLANT
NAIL TROCH GAMMA 11X18 (Nail) ×3 IMPLANT
NEEDLE HYPO 21X1.5 SAFETY (NEEDLE) ×3 IMPLANT
NEEDLE SPNL 18GX3.5 QUINCKE PK (NEEDLE) ×3 IMPLANT
NS IRRIG 1000ML POUR BTL (IV SOLUTION) ×3 IMPLANT
PACK BASIC III (CUSTOM PROCEDURE TRAY) ×2
PACK SRG BSC III STRL LF ECLPS (CUSTOM PROCEDURE TRAY) ×1 IMPLANT
PENCIL HANDSWITCHING (ELECTRODE) ×3 IMPLANT
SCREW LAG GAMMA 3 TI 10.5X80MM (Screw) ×3 IMPLANT
SCREW LOCKING T2 F/T  5X37.5MM (Screw) ×2 IMPLANT
SCREW LOCKING T2 F/T 5X37.5MM (Screw) ×1 IMPLANT
SET BASIN LINEN APH (SET/KITS/TRAYS/PACK) ×3 IMPLANT
SLING ARM FOAM STRAP LRG (SOFTGOODS) IMPLANT
SLING ARM FOAM STRAP MED (SOFTGOODS) IMPLANT
SLING ARM FOAM STRAP XLG (SOFTGOODS) IMPLANT
SPONGE LAP 18X18 X RAY DECT (DISPOSABLE) ×6 IMPLANT
STAPLER VISISTAT 35W (STAPLE) ×3 IMPLANT
SUT BRALON NAB BRD #1 30IN (SUTURE) ×3 IMPLANT
SUT MNCRL 0 VIOLET CTX 36 (SUTURE) ×1 IMPLANT
SUT MON AB 2-0 CT1 36 (SUTURE) IMPLANT
SUT MONOCRYL 0 CTX 36 (SUTURE) ×2
SYR 30ML LL (SYRINGE) ×3 IMPLANT
SYR BULB IRRIGATION 50ML (SYRINGE) ×6 IMPLANT
YANKAUER SUCT BULB TIP NO VENT (SUCTIONS) ×3 IMPLANT

## 2017-04-18 NOTE — Care Management Important Message (Signed)
Important Message  Patient Details  Name: Heather Cline MRN: [id number] Date of Birth: 04-Jun-1936   Medicare Important Message Given:  Yes    Malcolm MetroChildress, Lyris Hitchman Demske, RN 04/18/2017, 10:54 AM

## 2017-04-18 NOTE — Progress Notes (Signed)
Progress Note:     04/18/17 1400  SLP Visit Information  SLP Received On 04/18/17  Reason Eval/Treat Not Completed Patient at procedure or test/unavailable: Pt currently off the floor for procedure, ST to continue to monitor for readiness for swallow evaluation    Marcene Duoshelsea Sumney MA, CCC-SLP Acute Care Speech Language Pathologist

## 2017-04-18 NOTE — Anesthesia Procedure Notes (Signed)
Spinal  Patient location during procedure: OR Start time: 04/18/2017 12:52 PM Staffing Resident/CRNA: Dewon Mendizabal A Preanesthetic Checklist Completed: patient identified, site marked, surgical consent, pre-op evaluation, timeout performed, IV checked, risks and benefits discussed and monitors and equipment checked Spinal Block Patient position: left lateral decubitus Prep: Betadine Patient monitoring: heart rate, cardiac monitor, continuous pulse ox and blood pressure Approach: left paramedian Location: L3-4 Injection technique: single-shot Needle Needle type: Spinocan  Needle gauge: 22 G Needle length: 9 cm Assessment Sensory level: T8 Additional Notes  ATTEMPTS:1 TRAY EA:[id number] TRAY EXPIRATION DATE:08/04/2019

## 2017-04-18 NOTE — Consult Note (Signed)
CONSULT REQUEST  DR MEMON  REASON FRACTURED LEFT HIP   81 year old female with severe dementia household ambulator without assistive devices no major medical problems fell in her home tripping while making up her bed.  Due to the nature and severity of the dementia the patient cannot give a full history but her niece indicated that she fell could not stand and complained of severe  right hip pain.    Review of Systems  Unable to perform ROS: Dementia    Past Medical History:  Diagnosis Date  . Dementia    No prior surgeries  FAMILY HISTORY : COULD NOT OBTAIN 2ND TO THE DEMENTIA   Social History  Substance Use Topics  . Smoking status: Never Smoker  . Smokeless tobacco: Never Used  . Alcohol use No     Current Facility-Administered Medications:  .  0.45 % sodium chloride infusion, , Intravenous, Continuous, Delano MetzSchertz, Robert, MD, Last Rate: 75 mL/hr at 04/18/17 0030 .  0.9 %  sodium chloride infusion, , Intravenous, Once, Vickki HearingHarrison, Analeise Mccleery E, MD .  bisacodyl (DULCOLAX) suppository 10 mg, 10 mg, Rectal, Daily PRN, Delano MetzSchertz, Robert, MD .  ceFAZolin (ANCEF) IVPB 2g/100 mL premix, 2 g, Intravenous, On Call to OR, Vickki HearingHarrison, Tory Septer E, MD .  cefTRIAXone (ROCEPHIN) 1 g in dextrose 5 % 50 mL IVPB, 1 g, Intravenous, Q24H, Delano MetzSchertz, Robert, MD, Stopped at 04/18/17 0230 .  [START ON 04/19/2017] Chlorhexidine Gluconate Cloth 2 % PADS 6 each, 6 each, Topical, Q0600, Vickki HearingHarrison, Patra Gherardi E, MD .  docusate sodium (COLACE) capsule 100 mg, 100 mg, Oral, BID, Delano MetzSchertz, Robert, MD .  donepezil (ARICEPT) tablet 10 mg, 10 mg, Oral, QHS, Delano MetzSchertz, Robert, MD .  HYDROcodone-acetaminophen (NORCO/VICODIN) 5-325 MG per tablet 1-2 tablet, 1-2 tablet, Oral, Q6H PRN, Delano MetzSchertz, Robert, MD .  LORazepam (ATIVAN) injection 0.5-1 mg, 0.5-1 mg, Intravenous, Q4H PRN, Opyd, Lavone Neriimothy S, MD, 1 mg at 04/18/17 0610 .  magnesium hydroxide (MILK OF MAGNESIA) suspension 30 mL, 30 mL, Oral, Daily PRN, Delano MetzSchertz, Robert, MD .   morphine 2 MG/ML injection 1 mg, 1 mg, Intravenous, Q2H PRN, Delano MetzSchertz, Robert, MD, 1 mg at 04/18/17 0021 .  mupirocin ointment (BACTROBAN) 2 % 1 application, 1 application, Nasal, BID, Vickki HearingHarrison, Amoy Steeves E, MD, 1 application at 04/18/17 [id number] .  [START ON 04/19/2017] pneumococcal 23 valent vaccine (PNU-IMMUNE) injection 0.5 mL, 0.5 mL, Intramuscular, Tomorrow-1000, Schertz, Robert, MD .  povidone-iodine 10 % swab 2 application, 2 application, Topical, Once, Vickki HearingHarrison, Royal Beirne E, MD  BP (!) 150/65 (BP Location: Right Arm)   Pulse 90   Temp 99 F (37.2 C) (Oral)   Resp 18   SpO2 100%  Physical Exam  Constitutional: Vital signs are normal. She appears well-developed. She is sleeping.  Non-toxic appearance. She does not have a sickly appearance. She does not appear ill. No distress.  HENT:  Head: Normocephalic and atraumatic.  Right Ear: External ear normal.  Left Ear: External ear normal.  Nose: Nose normal.  Mouth/Throat: Oropharynx is clear and moist. No oropharyngeal exudate.  Eyes: Conjunctivae are normal. Right eye exhibits no discharge. Left eye exhibits no discharge. No scleral icterus.  Neck: No JVD present. No tracheal deviation present. No thyromegaly present.  Cardiovascular: Normal rate, regular rhythm, normal heart sounds and intact distal pulses.   Pulses:      Dorsalis pedis pulses are 2+ on the right side, and 2+ on the left side.       Posterior tibial pulses are 2+ on the right side, and  2+ on the left side.  Pulmonary/Chest: Effort normal. No respiratory distress. She has no wheezes. She has no rales.  Abdominal: Soft. She exhibits no distension and no mass. There is no tenderness. There is no rebound and no guarding.  Lymphadenopathy:    She has no cervical adenopathy.       Right: No inguinal adenopathy present.       Left: No inguinal adenopathy present.  Neurological: She displays no atrophy and no tremor. No sensory deficit. She exhibits normal muscle tone. She displays  no seizure activity. Gait abnormal.  Skin: Skin is warm, dry and intact. Capillary refill takes less than 2 seconds. No abrasion and no ecchymosis noted. Rash is not nodular and not urticarial. She is not diaphoretic. No cyanosis. Nails show no clubbing.    RIGHT AND LEFT ARMS:  Normal alignment, normal range of motion, no subluxation, NO atrophy OR tremor,  RIGHT LEG: Normal alignment, normal range of motion, no subluxation, no atrophy or tremor  LEFT LEG:  Abnormal leg alignment with external rotation, range of motion deferred because of pain, tenderness in the proximal femur, knee and ankle no subluxation, no atrophy no tremor    CBC Latest Ref Rng & Units 04/18/2017 04/17/2017 08/23/2012  WBC 4.0 - 10.5 K/uL 14.9(H) 11.4(H) 7.6  Hemoglobin 12.0 - 15.0 g/dL 09.6 04.5 15.2(H)  Hematocrit 36.0 - 46.0 % 44.0 44.7 45.9  Platelets 150 - 400 K/uL 269 294 286   BMP Latest Ref Rng & Units 04/18/2017 04/17/2017 08/23/2012  Glucose 65 - 99 mg/dL 409(W) 119(J) 478(G)  BUN 6 - 20 mg/dL 20 95(A) 21  Creatinine 0.44 - 1.00 mg/dL 2.13 0.86(V) 7.84  Sodium 135 - 145 mmol/L 139 142 142  Potassium 3.5 - 5.1 mmol/L 4.0 3.8 3.9  Chloride 101 - 111 mmol/L 106 107 105  CO2 22 - 32 mmol/L 24 26 29   Calcium 8.9 - 10.3 mg/dL 8.9 9.4 9.4    Hip x-rays: The hip x-ray I personally interpreted as a three-part intertrochanteric fracture of the left hip    CHEST X-RAYIMPRESSION: 1. Cardiomegaly with aortic atherosclerosis. 2. No acute pulmonary disease.   Electronically Signed   By: Tollie Eth M.D.   On: 04/17/2017 21:32  Diagnosis left hip fracture closed intertrochanteric comminuted displaced  Plan open treatment internal fixation left hip with gamma nail  Risk of the surgery have been discussed with the patient niece and will be discussed with the patient's husband who is the legal medical decision maker  (Bleeding infection nonunion hardware complication such as cutting through the bone blood  clot pulmonary embolus Inability to ambulate long-term limp leg length discrepancy)

## 2017-04-18 NOTE — Anesthesia Postprocedure Evaluation (Signed)
Anesthesia Post Note  Patient: Heather Cline  Procedure(s) Performed: Procedure(s) (LRB): INTRAMEDULLARY (IM) NAIL INTERTROCHANTRIC (Left)  Patient location during evaluation: PACU Anesthesia Type: Spinal Post-procedure mental status: Dementia; at baseline; non-communicative. Pain management: pain level controlled Vital Signs Assessment: post-procedure vital signs reviewed and stable Respiratory status: spontaneous breathing and patient connected to nasal cannula oxygen Cardiovascular status: stable Postop Assessment: no signs of nausea or vomiting Anesthetic complications: no     Last Vitals:  Vitals:   04/18/17 1430 04/18/17 1445  BP: (!) 99/55 122/66  Pulse: (P) 80 (P) 80  Resp: 20 15  Temp:      Last Pain:  Vitals:   04/18/17 1125  TempSrc: Axillary                 ADAMS, AMY A

## 2017-04-18 NOTE — Brief Op Note (Signed)
04/17/2017 - 04/18/2017  2:14 PM  PATIENT:  Heather BaileyPhyllis T Cline  81 y.o. female  PRE-OPERATIVE DIAGNOSIS:  left hip fracture  POST-OPERATIVE DIAGNOSIS:  left hip fracture  PROCEDURE:  Procedure(s): INTRAMEDULLARY (IM) NAIL INTERTROCHANTRIC (Left)  SURGEON:  Surgeon(s) and Role:    [name]* Harrison, Stanley E, MD - Primary  PHYSICIAN ASSISTANT:   ASSISTANTS: Canary Brimcynthia wren   ANESTHESIA:   spinal  EBL:  Total I/O In: 100 [I.V.:100] Out: 350 [Urine:300; Blood:50]  BLOOD ADMINISTERED:none  DRAINS: none   LOCAL MEDICATIONS USED:  MARCAINE     SPECIMEN:  No Specimen  DISPOSITION OF SPECIMEN:  N/A  COUNTS:  YES  TOURNIQUET:  * No tourniquets in log *  DICTATION: .Dragon Dictation  PLAN OF CARE: Admit to inpatient   PATIENT DISPOSITION:  PACU - hemodynamically stable.   Delay start of Pharmacological VTE agent (>24hrs) due to surgical blood loss or risk of bleeding: yes  [phone]

## 2017-04-18 NOTE — Progress Notes (Signed)
Pt is very anxious and trying to get up to "go home." She is removing ice pack from her hip and pulling at IV line and foley. IV wrapped and safety mitts applied. Telesitter initiated. Paged MD and received order for IV Ativan. Will administer and continue to monitor.

## 2017-04-18 NOTE — Transfer of Care (Signed)
Immediate Anesthesia Transfer of Care Note  Patient: Sudie Baileyhyllis T Rozzell  Procedure(s) Performed: Procedure(s): INTRAMEDULLARY (IM) NAIL INTERTROCHANTRIC (Left)  Patient Location: PACU  Anesthesia Type:Spinal  Level of Consciousness: Patient with dementia; at baseline  Airway & Oxygen Therapy: Patient Spontanous Breathing and Patient connected to nasal cannula oxygen  Post-op Assessment: Report given to RN and Post -op Vital signs reviewed and stable  Post vital signs: Reviewed and stable  Last Vitals:  Vitals:   04/18/17 1125 04/18/17 1200  BP: (!) 131/59 128/80  Pulse: 77   Resp: 20 (!) 24  Temp: 36.8 C     Last Pain:  Vitals:   04/18/17 1125  TempSrc: Axillary         Complications: No apparent anesthesia complications

## 2017-04-18 NOTE — Progress Notes (Signed)
In to administer Ativan, pt asleep. Did not administer Ativan at this time. Will continue to monitor.

## 2017-04-18 NOTE — Anesthesia Procedure Notes (Signed)
Date/Time: 04/18/2017 12:30 PM Performed by: Pernell DupreADAMS, AMY A Pre-anesthesia Checklist: Patient identified, Timeout performed, Emergency Drugs available, Suction available and Patient being monitored Oxygen Delivery Method: Simple face mask

## 2017-04-18 NOTE — Progress Notes (Signed)
PROGRESS NOTE    Heather Cline  ZOX:[id number] DOB: 1936-04-13 DOA: 04/17/2017 PCP: [name], MD    Brief Narrative:  81 year old female with a history of dementia was brought to the emergency room after having a mechanical fall and suffered a left hip fracture. She was seen by orthopedics and underwent operative management.   Assessment & Plan:   Principal Problem:   Closed displaced intertrochanteric fracture of left femur (HCC) Active Problems:   Fall at home, initial encounter   Severe dementia   Pyuria   CKD (chronic kidney disease) stage 3, GFR 30-59 ml/min   Fecal impaction (HCC)   Hip fracture (HCC)   1. Left hip fracture status post mechanical fall. Seen by orthopedics and underwent operative management. Will need further physical therapy. 2. Possible urinary tract infection. Started on Rocephin. Urine culture in process. 3. Severe dementia. Continue on Aricept. 4. Constipation with fecal impaction. Will give laxatives. This is unsuccessful, may need enema. 5. Chronic kidney disease stage III. Creatinine is currently at baseline.   DVT prophylaxis: Aspirin Code Status: Full code Family Communication: No family present Disposition Plan: Likely skilled nursing facility on discharge   Consultants:   Orthopedics  Procedures:   Left hip fracture INTRAMEDULLARY (IM) NAIL INTERTROCHANTRIC (Left)  Antimicrobials:   Rocephin 6/14>>    Subjective: Patient seen in room postoperatively. She is drowsy.  Objective: Vitals:   04/18/17 1556 04/18/17 1656 04/18/17 1758 04/18/17 1900  BP: 119/69 118/68 136/68 136/84  Pulse: 62 82 100 (!) 106  Resp: 18 15 16 18   Temp: 97.8 F (36.6 C) 98.1 F (36.7 C) 98 F (36.7 C) 99.9 F (37.7 C)  TempSrc: Axillary Axillary Axillary Oral  SpO2: 97% 100% 99% 99%    Intake/Output Summary (Last 24 hours) at 04/18/17 2013 Last data filed at 04/18/17 1427  Gross per 24 hour  Intake           946.25 ml  Output               350 ml  Net           596.25 ml   There were no vitals filed for this visit.  Examination:  General exam: Appears calm and comfortable  Respiratory system: Clear to auscultation. Respiratory effort normal. Cardiovascular system: S1 & S2 heard, RRR. No JVD, murmurs, rubs, gallops or clicks. No pedal edema. Gastrointestinal system: Abdomen is nondistended, soft and nontender. No organomegaly or masses felt. Normal bowel sounds heard. Central nervous system: Somnolent. No focal neurological deficits. Extremities: Symmetric 5 x 5 power. Skin: No rashes, lesions or ulcers Psychiatry: Drowsy    Data Reviewed: I have personally reviewed following labs and imaging studies  CBC:  Recent Labs Lab 04/17/17 2037 04/18/17 0500  WBC 11.4* 14.9*  NEUTROABS 8.8*  --   HGB 14.7 14.5  HCT 44.7 44.0  MCV 93.3 93.0  PLT 294 269   Basic Metabolic Panel:  Recent Labs Lab 04/17/17 2041 04/18/17 0500  NA 142 139  K 3.8 4.0  CL 107 106  CO2 26 24  GLUCOSE 133* 126*  BUN 23* 20  CREATININE 1.28* 0.96  CALCIUM 9.4 8.9   GFR: CrCl cannot be calculated (Unknown ideal weight.). Liver Function Tests: No results for input(s): AST, ALT, ALKPHOS, BILITOT, PROT, ALBUMIN in the last 168 hours. No results for input(s): LIPASE, AMYLASE in the last 168 hours. No results for input(s): AMMONIA in the last 168 hours. Coagulation Profile:  Recent Labs  Lab 04/17/17 2041  INR 1.00   Cardiac Enzymes: No results for input(s): CKTOTAL, CKMB, CKMBINDEX, TROPONINI in the last 168 hours. BNP (last 3 results) No results for input(s): PROBNP in the last 8760 hours. HbA1C: No results for input(s): HGBA1C in the last 72 hours. CBG: No results for input(s): GLUCAP in the last 168 hours. Lipid Profile: No results for input(s): CHOL, HDL, LDLCALC, TRIG, CHOLHDL, LDLDIRECT in the last 72 hours. Thyroid Function Tests: No results for input(s): TSH, T4TOTAL, FREET4, T3FREE, THYROIDAB in the last 72  hours. Anemia Panel: No results for input(s): VITAMINB12, FOLATE, FERRITIN, TIBC, IRON, RETICCTPCT in the last 72 hours. Sepsis Labs: No results for input(s): PROCALCITON, LATICACIDVEN in the last 168 hours.  Recent Results (from the past 240 hour(s))  Surgical PCR screen     Status: Abnormal   Collection Time: 04/17/17 11:54 PM  Result Value Ref Range Status   MRSA, PCR POSITIVE (A) NEGATIVE Final    Comment: RESULT CALLED TO, READ BACK BY AND VERIFIED WITH: DANIEL,C AT 0535 BY HUFFINES,S ON 04/18/17.    Staphylococcus aureus POSITIVE (A) NEGATIVE Final    Comment:        The Xpert SA Assay (FDA approved for NASAL specimens in patients over 821 years of age), is one component of a comprehensive surveillance program.  Test performance has been validated by [facility] for patients greater than or equal to 646 year old. It is not intended to diagnose infection nor to guide or monitor treatment. RESULT CALLED TO, READ BACK BY AND VERIFIED WITH: DANIEL,C AT 0535 BY HUFFINES,S ON 04/18/17.          Radiology Studies: Dg Chest 1 View  Result Date: 04/17/2017 CLINICAL DATA:  Patient fell onto left side. History of Alzheimer's. Fracture of the hip. Preop. EXAM: CHEST 1 VIEW COMPARISON:  None. FINDINGS: Cardiomegaly with aortic atherosclerosis. Lungs are clear without effusion or pneumothorax. No pulmonary consolidation or contusion. Old left-sided fifth through seventh rib fractures. No acute osseous abnormality. IMPRESSION: 1. Cardiomegaly with aortic atherosclerosis. 2. No acute pulmonary disease. Electronically Signed   By: Tollie Ethavid  Kwon M.D.   On: 04/17/2017 21:32   Dg Hip Operative Unilat With Pelvis Left  Result Date: 04/18/2017 CLINICAL DATA:  Open reduction and internal fixation of left hip fracture. EXAM: OPERATIVE left HIP (WITH PELVIS IF PERFORMED) 8 VIEWS TECHNIQUE: Fluoroscopic spot image(s) were submitted for interpretation post-operatively. FLUOROSCOPY TIME:  1 minutes  30 seconds. COMPARISON:  Radiographs of April 17, 2017. FINDINGS: Eight intraoperative fluoroscopic images of the left hip demonstrate internal fixation of comminuted intertrochanteric fracture of the proximal left femur. IMPRESSION: Status post reduction and internal fixation of proximal left femoral intertrochanteric fracture. Electronically Signed   By: Lupita RaiderJames  Green Jr, M.D.   On: 04/18/2017 14:21   Dg Hip Unilat With Pelvis 2-3 Views Left  Result Date: 04/17/2017 CLINICAL DATA:  Patient fell onto left side.  Left hip fracture. EXAM: DG HIP (WITH OR WITHOUT PELVIS) 2-3V LEFT COMPARISON:  None. FINDINGS: An acute, closed, varus angulated intertrochanteric fracture of the left femur is noted with avulsed lesser trochanter. The femoral head is seated within the left hip joint with joint space narrowing noted. The bony pelvis appears grossly intact. A large stool ball projects over the mid pelvis obscuring the lower sacrum and coccyx. IMPRESSION: 1. Acute, closed, varus angulated intertrochanteric fracture of the left femur with avulsed lesser trochanter. No hip joint dislocation. 2. Intact appearing bony pelvis given limitations of what appears  to be fecal impaction in the mid pelvis. Electronically Signed   By: Tollie Eth M.D.   On: 04/17/2017 21:35        Scheduled Meds: . [START ON 04/19/2017] aspirin EC  325 mg Oral Q breakfast  . [START ON 04/19/2017] Chlorhexidine Gluconate Cloth  6 each Topical Q0600  . docusate sodium  100 mg Oral BID  . donepezil  10 mg Oral QHS  . [START ON 04/19/2017] feeding supplement (ENSURE ENLIVE)  237 mL Oral BID BM  . multivitamin with minerals  1 tablet Oral Daily  . mupirocin ointment  1 application Nasal BID  . [START ON 04/19/2017] pneumococcal 23 valent vaccine  0.5 mL Intramuscular Tomorrow-1000   Continuous Infusions: . sodium chloride 75 mL/hr at 04/18/17 1751  . [START ON 04/19/2017] cefTRIAXone (ROCEPHIN)  IV       LOS: 1 day    Time spent:     Tanica Gaige, MD [facility] Pager [phone]  If 7PM-7AM, please contact night-coverage [email or web address] Password [facility] 04/18/2017, 8:13 PM

## 2017-04-18 NOTE — Anesthesia Preprocedure Evaluation (Signed)
Anesthesia Evaluation  Patient identified by MRN, date of birth, ID band Patient unresponsive    Reviewed: Allergy & Precautions, NPO status , Patient's Chart, lab work & pertinent test results, Unable to perform ROS - Chart review only  Airway Mallampati: III  TM Distance: <3 FB     Dental   Pulmonary neg pulmonary ROS,    Pulmonary exam normal        Cardiovascular negative cardio ROS   Rhythm:Regular Rate:Normal     Neuro/Psych PSYCHIATRIC DISORDERS (Severe Dementia - not responsive to verbal stimuli.)    GI/Hepatic negative GI ROS,   Endo/Other    Renal/GU Renal disease     Musculoskeletal   Abdominal   Peds  Hematology   Anesthesia Other Findings   Reproductive/Obstetrics                             Anesthesia Physical Anesthesia Plan  ASA: III  Anesthesia Plan: Spinal   Post-op Pain Management:    Induction:   PONV Risk Score and Plan:   Airway Management Planned: Simple Face Mask  Additional Equipment:   Intra-op Plan:   Post-operative Plan:   Informed Consent: I have reviewed the patients History and Physical, chart, labs and discussed the procedure including the risks, benefits and alternatives for the proposed anesthesia with the patient or authorized representative who has indicated his/her understanding and acceptance.     Plan Discussed with:   Anesthesia Plan Comments:         Anesthesia Quick Evaluation

## 2017-04-18 NOTE — Op Note (Signed)
04/18/2017 2:19 PM   Surgical dictation on left hip fracture left gamma nail  Preop diagnosis closed intertrochanteric fracture left hip displaced three-part  Postop diagnosis same  Procedure open treatment internal fixation with short 125 gamma nail with 80 mm lag screw proximal acorn in sliding mode and distal locking screw 37.5 mm  Surgeon Romeo AppleHarrison  Assisted by Cecile Sheererynthia Wrenn  Anesthesia spinal  Findings three-part intertrochanteric fracture left hip  Procedure was done as follows  Site marking chart review and reexamination patient was performed in preop and patient was taken to the operating room where she had a spinal anesthetic. She was placed on a fracture table. We padded the right leg and placed in a well leg holder we placed the left leg in traction. Ancef was started per patient weight  The leg was placed in traction as stated C-arm was brought in and radiographs were taken and the leg was manipulated with internal rotation and traction until stable reduction was obtained  We then used a sterile prep and drape technique and followed that with timeout which was completed  I made an incision over the greater trochanter extended down to the fascia split this fascia in line with the skin incision and divided the muscle bluntly. I palpated the greater trochanter and then placed a curved awl into the tip of the trochanter passed down the femoral canal followed by a guidewire which went down to the knee  This was confirmed by C-arm radiographs and then we passed a reamer over the guidewire down to the lesser trochanter.  125 gamma nail was passed over the wire. A separate stab incision was made to pass the cannula for the lag screw. This was advanced down to bone. The canal was opened with a drill bit and a threaded tip guidewire was placed in the center the femoral head on both AP and lateral x-ray  The guide pin measured 80 mm we then set the triple reamer for 80 and passed  over the guidewire. Past the screw over the guidewire and then irrigated the proximal portion of the insertion device and then passed a acorn screw and place it in sliding mode. I confirmed engagement by toggling on the screwdriver distally  We then passed the cannula through another stab incision down to bone to pass the locking screw. I drilled with appropriate drill bit and then measured off the drill bit and passed a 37.5 mm screw and then confirm this by x-ray  All x-rays showed the hardware in good position the fracture reduced the hardware without any complications noted  After thorough irrigation and layered closure was performed proximally with #1 Bralon and 0 Monocryl and staples. 2-0 Monocryl sutures were placed in the second wound and none in the third wound but staples were applied in all 3 wounds  We did inject a total of 60 mL of Marcaine in the wound 30 mL subfascial 30 mL subcutaneous  Sterile dressing was applied  The patient was placed back on the fracture table taken back to the recovery room in stable condition. Her leg length looked excellent and a rotational alignment looks great as well  Postop plan she is weightbearing as tolerated Staples come out day 12-14 X-rays at 2, 6 and 12 weeks Follow-up at 26 and 12 weeks in the office for x-ray

## 2017-04-19 LAB — CBC
HEMATOCRIT: 35.1 % — AB (ref 36.0–46.0)
HEMOGLOBIN: 11.7 g/dL — AB (ref 12.0–15.0)
MCH: 30.7 pg (ref 26.0–34.0)
MCHC: 33.3 g/dL (ref 30.0–36.0)
MCV: 92.1 fL (ref 78.0–100.0)
PLATELETS: 232 10*3/uL (ref 150–400)
RBC: 3.81 MIL/uL — AB (ref 3.87–5.11)
RDW: 13.1 % (ref 11.5–15.5)
WBC: 17.7 10*3/uL — AB (ref 4.0–10.5)

## 2017-04-19 LAB — BASIC METABOLIC PANEL
ANION GAP: 8 (ref 5–15)
BUN: 16 mg/dL (ref 6–20)
CHLORIDE: 102 mmol/L (ref 101–111)
CO2: 23 mmol/L (ref 22–32)
Calcium: 8.1 mg/dL — ABNORMAL LOW (ref 8.9–10.3)
Creatinine, Ser: 0.91 mg/dL (ref 0.44–1.00)
GFR calc Af Amer: >60 mL/min (ref >60)
GFR, EST NON AFRICAN AMERICAN: 58 mL/min — AB (ref >60)
GLUCOSE: 126 mg/dL — AB (ref 65–99)
POTASSIUM: 3.5 mmol/L (ref 3.5–5.1)
Sodium: 133 mmol/L — ABNORMAL LOW (ref 135–145)

## 2017-04-19 LAB — URINE CULTURE: CULTURE: NO GROWTH

## 2017-04-19 LAB — GLUCOSE, CAPILLARY: GLUCOSE-CAPILLARY: 107 mg/dL — AB (ref 65–99)

## 2017-04-19 LAB — VITAMIN D 25 HYDROXY (VIT D DEFICIENCY, FRACTURES): Vit D, 25-Hydroxy: 30.2 ng/mL (ref 30.0–100.0)

## 2017-04-19 MED ORDER — METOPROLOL TARTRATE 25 MG PO TABS
12.5000 mg | ORAL_TABLET | Freq: Two times a day (BID) | ORAL | Status: DC
  Administered 2017-04-19 – 2017-04-23 (×7): 12.5 mg via ORAL
  Filled 2017-04-19 (×8): qty 1

## 2017-04-19 MED ORDER — MILK AND MOLASSES ENEMA
1.0000 | Freq: Once | RECTAL | Status: AC
  Administered 2017-04-19: 250 mL via RECTAL

## 2017-04-19 MED ORDER — SODIUM CHLORIDE 0.9 % IV BOLUS (SEPSIS)
500.0000 mL | Freq: Once | INTRAVENOUS | Status: AC
  Administered 2017-04-19: 500 mL via INTRAVENOUS

## 2017-04-19 MED ORDER — SODIUM CHLORIDE 0.9 % IV BOLUS (SEPSIS): 500.0000 mL | Freq: Once | INTRAVENOUS | Status: DC

## 2017-04-19 NOTE — Plan of Care (Signed)
Problem: Pain Managment: Goal: General experience of comfort will improve Outcome: Progressing Pt has not complained of pain but has moaning sounds and grimacing on face at times. Pain medication given x2 this shift. Pt off of affected area and turned from R side to back q2h. Will continue to monitor pt

## 2017-04-19 NOTE — Progress Notes (Signed)
Patient ID: Heather Cline, female   DOB: 1935/12/28, 81 y.o.   MRN: [id number] POS 1   LEFT GAMMA NAIL   CBC Latest Ref Rng & Units 04/19/2017 04/18/2017 04/17/2017  WBC 4.0 - 10.5 K/uL 17.7(H) 14.9(H) 11.4(H)  Hemoglobin 12.0 - 15.0 g/dL 11.7(L) 14.5 14.7  Hematocrit 36.0 - 46.0 % 35.1(L) 44.0 44.7  Platelets 150 - 400 K/uL 232 269 294   BMP Latest Ref Rng & Units 04/19/2017 04/18/2017 04/17/2017  Glucose 65 - 99 mg/dL 782(N126(H) 562(Z126(H) 308(M133(H)  BUN 6 - 20 mg/dL 16 20 57(Q23(H)  Creatinine 0.44 - 1.00 mg/dL 4.690.91 6.290.96 5.28(U1.28(H)  Sodium 135 - 145 mmol/L 133(L) 139 142  Potassium 3.5 - 5.1 mmol/L 3.5 4.0 3.8  Chloride 101 - 111 mmol/L 102 106 107  CO2 22 - 32 mmol/L 23 24 26   Calcium 8.9 - 10.3 mg/dL 8.1(L) 8.9 9.4   PT SAYS SHE DID NOT DO WELL BUT I M NOT SURPRISED DUE TO DEMENTIA.   EXPECT SLOW PROGRESS

## 2017-04-19 NOTE — Evaluation (Addendum)
Physical Therapy Evaluation Patient Details Name: Heather Cline MRN: [id number] DOB: 1936-06-13 Today's Date: 04/19/2017   History of Present Illness  Heather Cline is a 81 y.o. female with history of severe dementia but no other medical conditions, takes only Aricept.  Pt fell at home today , in ED xrays show L intertrochanteric fracture of the left femur.  Clinical Impression  Per chart pt is normally ambulatory without assistive device.  Pt will not follow command at this time. Total assist to sit but will hold self up once she is sitting on the edge of the bed.  Will continue to see daily to attempt to increase pt mobility.     Follow Up Recommendations SNF    Equipment Recommendations  None recommended by PT    Recommendations for Other Services       Precautions / Restrictions Precautions Precautions: Fall Restrictions Weight Bearing Restrictions: Yes LLE Weight Bearing: Weight bearing as tolerated      Mobility  Bed Mobility Overal bed mobility: Needs Assistance Bed Mobility: Supine to Sit;Sit to Supine     Supine to sit: Total assist Sit to supine: Total assist   General bed mobility comments: once sitting was able to balance self I             Pertinent Vitals/Pain Pain Assessment: Faces Faces Pain Scale: Hurts little more Pain Intervention(s): Limited activity within patient's tolerance    Home Living Family/patient expects to be discharged to:: Skilled nursing facility                      Prior Function           Comments: No family present and pt has hx of dementia; currently non verbal with therapist.  Per chart review pt ambulated without assistive device but did wander.     Hand Dominance        Extremity/Trunk Assessment        Lower Extremity Assessment Lower Extremity Assessment: Difficult to assess due to impaired cognition (Pt will not respond to command even with her right LE )       Communication   Communication: Other (comment) (significant dementia does not communicate)  Cognition Arousal/Alertness: Lethargic   Overall Cognitive Status: History of cognitive impairments - at baseline                                        General Comments      Exercises General Exercises - Lower Extremity Ankle Circles/Pumps: PROM;Both;5 reps Short Arc Quad: PROM;Both;5 reps Heel Slides: PROM;Both;5 reps Hip ABduction/ADduction: PROM;5 reps;Both   Assessment/Plan    PT Assessment Patient needs continued PT services  PT Problem List Decreased strength;Decreased activity tolerance;Decreased balance;Decreased mobility;Pain       PT Treatment Interventions Functional mobility training;Therapeutic exercise    PT Goals (Current goals can be found in the Care Plan section)       Frequency Min 5X/week   Barriers to discharge        Co-evaluation               AM-PAC PT "6 Clicks" Daily Activity  Outcome Measure Difficulty turning over in bed (including adjusting bedclothes, sheets and blankets)?: Total Difficulty moving from lying on back to sitting on the side of the bed? : Total Difficulty sitting down on and standing up from a chair  with arms (e.g., wheelchair, bedside commode, etc,.)?: Total Help needed moving to and from a bed to chair (including a wheelchair)?: Total Help needed walking in hospital room?: Total Help needed climbing 3-5 steps with a railing? : Total 6 Click Score: 6    End of Session   Activity Tolerance: Patient tolerated treatment well;Treatment limited secondary to medical complications (Comment) Patient left: in bed;with call bell/phone within reach;with bed alarm set Nurse Communication: Mobility status PT Visit Diagnosis: Difficulty in walking, not elsewhere classified (R26.2);Pain    Time: 1000-1046 PT Time Calculation (min) (ACUTE ONLY): 46 min   Charges:   PT Evaluation $PT Eval Moderate Complexity: 1 Procedure     PT G  CodesVirgina Organ:        Cynthia Russell, PT CLT [phone] 04/19/2017, 11:46 AM

## 2017-04-19 NOTE — Progress Notes (Signed)
Late entry:  Dr. Kerry HoughMemon on unit and notified of patient's EKG results.

## 2017-04-19 NOTE — Progress Notes (Signed)
PROGRESS NOTE    Heather Cline  WGN:[id number] DOB: 1936/09/24 DOA: 04/17/2017 PCP: Benita StabileHall, John Z, MD    Brief Narrative:  81 year old female with a history of dementia was brought to the emergency room after having a mechanical fall and suffered a left hip fracture. She was seen by orthopedics and underwent operative management.   Assessment & Plan:   Principal Problem:   Closed displaced intertrochanteric fracture of left femur (HCC) Active Problems:   Fall at home, initial encounter   Severe dementia   Pyuria   CKD (chronic kidney disease) stage 3, GFR 30-59 ml/min   Fecal impaction (HCC)   Hip fracture (HCC)   1. Left hip fracture status post mechanical fall. Seen by orthopedics and underwent operative management. Will need further physical therapy. 2. Possible urinary tract infection. Started on Rocephin. Urine culture shows no growth. We'll discontinue Rocephin. 3. Severe dementia. Continue on Aricept. 4. Constipation with fecal impaction. She is not had a bowel movement yet. Will give an enema.. 5. Chronic kidney disease stage III. Creatinine is currently at baseline. 6. Tachycardia. EKG shows sinus rhythm with PACs. Will start on low-dose beta blockers.   DVT prophylaxis: Aspirin Code Status: Full code Family Communication: No family present Disposition Plan: Likely skilled nursing facility on discharge   Consultants:   Orthopedics  Procedures:   Left hip fracture INTRAMEDULLARY (IM) NAIL INTERTROCHANTRIC (Left)  Antimicrobials:   Rocephin 6/14>> 6/16   Subjective: Patient is awake. She is confused.  Objective: Vitals:   04/19/17 1133 04/19/17 1226 04/19/17 1423 04/19/17 1532  BP: 110/61  (!) 97/49 (!) 97/36  Pulse:  (!) 126 96 77  Resp:   20   Temp:   97.9 F (36.6 C)   TempSrc:   Axillary   SpO2:   98%     Intake/Output Summary (Last 24 hours) at 04/19/17 1931 Last data filed at 04/19/17 0500  Gross per 24 hour  Intake          2136.25  ml  Output              500 ml  Net          1636.25 ml   There were no vitals filed for this visit.  Examination:  General exam: Appears calm and comfortable  Respiratory system: Clear to auscultation. Respiratory effort normal. Cardiovascular system: S1 & S2 heard, irregular. No JVD, murmurs, rubs, gallops or clicks. No pedal edema. Gastrointestinal system: Abdomen is nondistended, soft and nontender. No organomegaly or masses felt. Normal bowel sounds heard. Central nervous system:  No focal neurological deficits. Extremities: Symmetric 5 x 5 power. Skin: No rashes, lesions or ulcers Psychiatry: Confused    Data Reviewed: I have personally reviewed following labs and imaging studies  CBC:  Recent Labs Lab 04/17/17 2037 04/18/17 0500 04/19/17 0616  WBC 11.4* 14.9* 17.7*  NEUTROABS 8.8*  --   --   HGB 14.7 14.5 11.7*  HCT 44.7 44.0 35.1*  MCV 93.3 93.0 92.1  PLT 294 269 232   Basic Metabolic Panel:  Recent Labs Lab 04/17/17 2041 04/18/17 0500 04/19/17 0616  NA 142 139 133*  K 3.8 4.0 3.5  CL 107 106 102  CO2 26 24 23   GLUCOSE 133* 126* 126*  BUN 23* 20 16  CREATININE 1.28* 0.96 0.91  CALCIUM 9.4 8.9 8.1*   GFR: CrCl cannot be calculated (Unknown ideal weight.). Liver Function Tests: No results for input(s): AST, ALT, ALKPHOS, BILITOT, PROT, ALBUMIN in  the last 168 hours. No results for input(s): LIPASE, AMYLASE in the last 168 hours. No results for input(s): AMMONIA in the last 168 hours. Coagulation Profile:  Recent Labs Lab 04/17/17 2041  INR 1.00   Cardiac Enzymes: No results for input(s): CKTOTAL, CKMB, CKMBINDEX, TROPONINI in the last 168 hours. BNP (last 3 results) No results for input(s): PROBNP in the last 8760 hours. HbA1C: No results for input(s): HGBA1C in the last 72 hours. CBG: No results for input(s): GLUCAP in the last 168 hours. Lipid Profile: No results for input(s): CHOL, HDL, LDLCALC, TRIG, CHOLHDL, LDLDIRECT in the last 72  hours. Thyroid Function Tests: No results for input(s): TSH, T4TOTAL, FREET4, T3FREE, THYROIDAB in the last 72 hours. Anemia Panel: No results for input(s): VITAMINB12, FOLATE, FERRITIN, TIBC, IRON, RETICCTPCT in the last 72 hours. Sepsis Labs: No results for input(s): PROCALCITON, LATICACIDVEN in the last 168 hours.  Recent Results (from the past 240 hour(s))  Urine culture     Status: None   Collection Time: 04/17/17  9:09 PM  Result Value Ref Range Status   Specimen Description URINE, RANDOM  Final   Special Requests NONE  Final   Culture   Final    NO GROWTH Performed at [facility], [address] [place], [place] [address]    Report Status 04/19/2017 FINAL  Final  Surgical PCR screen     Status: Abnormal   Collection Time: 04/17/17 11:54 PM  Result Value Ref Range Status   MRSA, PCR POSITIVE (A) NEGATIVE Final    Comment: RESULT CALLED TO, READ BACK BY AND VERIFIED WITH: DANIEL,C AT 0535 BY HUFFINES,S ON 04/18/17.    Staphylococcus aureus POSITIVE (A) NEGATIVE Final    Comment:        The Xpert SA Assay (FDA approved for NASAL specimens in patients over 38 years of age), is one component of a comprehensive surveillance program.  Test performance has been validated by [facility] for patients greater than or equal to 79 year old. It is not intended to diagnose infection nor to guide or monitor treatment. RESULT CALLED TO, READ BACK BY AND VERIFIED WITH: DANIEL,C AT 0535 BY HUFFINES,S ON 04/18/17.          Radiology Studies: Dg Chest 1 View  Result Date: 04/17/2017 CLINICAL DATA:  Patient fell onto left side. History of Alzheimer's. Fracture of the hip. Preop. EXAM: CHEST 1 VIEW COMPARISON:  None. FINDINGS: Cardiomegaly with aortic atherosclerosis. Lungs are clear without effusion or pneumothorax. No pulmonary consolidation or contusion. Old left-sided fifth through seventh rib fractures. No acute osseous abnormality. IMPRESSION: 1. Cardiomegaly with  aortic atherosclerosis. 2. No acute pulmonary disease. Electronically Signed   By: Tollie Eth M.D.   On: 04/17/2017 21:32   Dg Hip Operative Unilat With Pelvis Left  Result Date: 04/18/2017 CLINICAL DATA:  Open reduction and internal fixation of left hip fracture. EXAM: OPERATIVE left HIP (WITH PELVIS IF PERFORMED) 8 VIEWS TECHNIQUE: Fluoroscopic spot image(s) were submitted for interpretation post-operatively. FLUOROSCOPY TIME:  1 minutes 30 seconds. COMPARISON:  Radiographs of April 17, 2017. FINDINGS: Eight intraoperative fluoroscopic images of the left hip demonstrate internal fixation of comminuted intertrochanteric fracture of the proximal left femur. IMPRESSION: Status post reduction and internal fixation of proximal left femoral intertrochanteric fracture. Electronically Signed   By: Lupita Raider, M.D.   On: 04/18/2017 14:21   Dg Hip Unilat With Pelvis 2-3 Views Left  Result Date: 04/17/2017 CLINICAL DATA:  Patient fell onto left  side.  Left hip fracture. EXAM: DG HIP (WITH OR WITHOUT PELVIS) 2-3V LEFT COMPARISON:  None. FINDINGS: An acute, closed, varus angulated intertrochanteric fracture of the left femur is noted with avulsed lesser trochanter. The femoral head is seated within the left hip joint with joint space narrowing noted. The bony pelvis appears grossly intact. A large stool ball projects over the mid pelvis obscuring the lower sacrum and coccyx. IMPRESSION: 1. Acute, closed, varus angulated intertrochanteric fracture of the left femur with avulsed lesser trochanter. No hip joint dislocation. 2. Intact appearing bony pelvis given limitations of what appears to be fecal impaction in the mid pelvis. Electronically Signed   By: Tollie Eth M.D.   On: 04/17/2017 21:35        Scheduled Meds: . aspirin EC  325 mg Oral Q breakfast  . Chlorhexidine Gluconate Cloth  6 each Topical Q0600  . docusate sodium  100 mg Oral BID  . donepezil  10 mg Oral QHS  . feeding supplement (ENSURE  ENLIVE)  237 mL Oral BID BM  . metoprolol tartrate  12.5 mg Oral BID  . milk and molasses  1 enema Rectal Once  . multivitamin with minerals  1 tablet Oral Daily  . mupirocin ointment  1 application Nasal BID  . pneumococcal 23 valent vaccine  0.5 mL Intramuscular Tomorrow-1000   Continuous Infusions: . sodium chloride 75 mL/hr at 04/19/17 0539  . cefTRIAXone (ROCEPHIN)  IV Stopped (04/19/17 0205)     LOS: 2 days    Time spent:    Bright Spielmann, MD [facility] Pager [phone]  If 7PM-7AM, please contact night-coverage [email or web address] Password [facility] 04/19/2017, 7:31 PM

## 2017-04-20 ENCOUNTER — Inpatient Hospital Stay (HOSPITAL_COMMUNITY): Payer: Medicare Other

## 2017-04-20 LAB — BASIC METABOLIC PANEL
ANION GAP: 9 (ref 5–15)
BUN: 13 mg/dL (ref 6–20)
CALCIUM: 8.3 mg/dL — AB (ref 8.9–10.3)
CHLORIDE: 105 mmol/L (ref 101–111)
CO2: 24 mmol/L (ref 22–32)
Creatinine, Ser: 0.85 mg/dL (ref 0.44–1.00)
GFR calc non Af Amer: >60 mL/min (ref >60)
Glucose, Bld: 136 mg/dL — ABNORMAL HIGH (ref 65–99)
Potassium: 3.5 mmol/L (ref 3.5–5.1)
SODIUM: 138 mmol/L (ref 135–145)

## 2017-04-20 LAB — CBC
HCT: 35.3 % — ABNORMAL LOW (ref 36.0–46.0)
HEMOGLOBIN: 11.9 g/dL — AB (ref 12.0–15.0)
MCH: 30.9 pg (ref 26.0–34.0)
MCHC: 33.7 g/dL (ref 30.0–36.0)
MCV: 91.7 fL (ref 78.0–100.0)
Platelets: 228 10*3/uL (ref 150–400)
RBC: 3.85 MIL/uL — ABNORMAL LOW (ref 3.87–5.11)
RDW: 13.1 % (ref 11.5–15.5)
WBC: 22.4 10*3/uL — AB (ref 4.0–10.5)

## 2017-04-20 LAB — LACTIC ACID, PLASMA
LACTIC ACID, VENOUS: 2.9 mmol/L — AB (ref 0.5–1.9)
Lactic Acid, Venous: 2.5 mmol/L (ref 0.5–1.9)

## 2017-04-20 MED ORDER — SODIUM CHLORIDE 0.9 % IV BOLUS (SEPSIS)
1000.0000 mL | Freq: Once | INTRAVENOUS | Status: AC
  Administered 2017-04-20: 1000 mL via INTRAVENOUS

## 2017-04-20 MED ORDER — IOPAMIDOL (ISOVUE-300) INJECTION 61%
INTRAVENOUS | Status: AC
  Administered 2017-04-20: 17:00:00
  Filled 2017-04-20: qty 30

## 2017-04-20 MED ORDER — IOPAMIDOL (ISOVUE-300) INJECTION 61%
100.0000 mL | Freq: Once | INTRAVENOUS | Status: AC | PRN
  Administered 2017-04-20: 100 mL via INTRAVENOUS

## 2017-04-20 NOTE — Progress Notes (Signed)
PT Cancellation Note  Patient Details Name: Sudie Baileyhyllis T Delapaz MRN: [id number] DOB: 1936-06-28   Cancelled Treatment:    Reason Eval/Treat Not Completed: Patient's level of consciousness.  Checked on pt; pt is still not responding to therapist.    Virgina OrganCynthia Kutter Schnepf, PT CLT [phone] 04/20/2017, 11:55 AM

## 2017-04-20 NOTE — Anesthesia Postprocedure Evaluation (Signed)
Anesthesia Post Note  Patient: Sudie Baileyhyllis T Diener  Procedure(s) Performed: Procedure(s) (LRB): INTRAMEDULLARY (IM) NAIL INTERTROCHANTRIC (Left)  Patient location during evaluation: Nursing Unit Anesthesia Type: Spinal Level of consciousness: confused and awake (At baseline) Pain management: pain level controlled Vital Signs Assessment: post-procedure vital signs reviewed and stable Respiratory status: spontaneous breathing Cardiovascular status: stable Postop Assessment: no signs of nausea or vomiting Anesthetic complications: no     Last Vitals:  Vitals:   04/19/17 2300 04/20/17 0615  BP: (!) 115/44 (!) 92/45  Pulse: 72 63  Resp: 20 20  Temp: 36.3 C 36.8 C    Last Pain:  Vitals:   04/20/17 0615  TempSrc: Oral  PainSc:                  Glynn OctaveANIEL,Chaela Branscum

## 2017-04-20 NOTE — Addendum Note (Signed)
Addendum  created 04/20/17 0919 by Moshe Salisburyaniel, Myrlene Riera E, CRNA   Sign clinical note

## 2017-04-20 NOTE — Progress Notes (Signed)
PROGRESS NOTE    Heather Cline  [id number] DOB: [birth year] DOA: 04/17/2017 PCP: Benita StabileHall, John Z, MD    Brief Narrative:  81 year old female with a history of dementia was brought to the emergency room after having a mechanical fall and suffered a left hip fracture. She was seen by orthopedics and underwent operative management.   Assessment & Plan:   Principal Problem:   Closed displaced intertrochanteric fracture of left femur (HCC) Active Problems:   Fall at home, initial encounter   Severe dementia   Pyuria   CKD (chronic kidney disease) stage 3, GFR 30-59 ml/min   Fecal impaction (HCC)   Hip fracture (HCC)   1. Left hip fracture status post mechanical fall. Seen by orthopedics and underwent operative management. Will need further physical therapy. 2. Possible urinary tract infection. Started on Rocephin. Urine culture shows no growth. We'll discontinue Rocephin. 3. Severe dementia. Continue on Aricept. 4. Constipation with fecal impaction. Resolved after receiving enema 5. Chronic kidney disease stage III. Creatinine is currently at baseline. 6. Tachycardia. EKG shows sinus rhythm with PACs. Started on low-dose beta blockers with improvement of heart rate. 7. Leukocytosis. Etiology is unclear. The degree of leukocytosis is concerning. Check chest x-ray, CT abdomen since she is tender on exam. Check lactic acid.   DVT prophylaxis: Aspirin Code Status: Full code Family Communication: No family present Disposition Plan: Likely skilled nursing facility on discharge   Consultants:   Orthopedics  Procedures:   Left hip fracture INTRAMEDULLARY (IM) NAIL INTERTROCHANTRIC (Left)  Antimicrobials:   Rocephin 6/14>> 6/16   Subjective: Patient is awake. Answers yes to all questions. Confused  Objective: Vitals:   04/19/17 1423 04/19/17 1532 04/19/17 2300 04/20/17 0615  BP: (!) 97/49 (!) 97/36 (!) 115/44 (!) 92/45  Pulse: 96 77 72 63  Resp: 20  20 20   Temp: 97.9  F (36.6 C)  97.4 F (36.3 C) 98.3 F (36.8 C)  TempSrc: Axillary  Oral Oral  SpO2: 98%  98% 99%    Intake/Output Summary (Last 24 hours) at 04/20/17 1609 Last data filed at 04/20/17 1245  Gross per 24 hour  Intake                0 ml  Output              700 ml  Net             -700 ml   There were no vitals filed for this visit.  Examination:  General exam: Appears calm and comfortable  Respiratory system: Clear to auscultation. Respiratory effort normal. Cardiovascular system: S1 & S2 heard, irregular. No JVD, murmurs, rubs, gallops or clicks. No pedal edema. Gastrointestinal system: Abdomen is nondistended, soft and Diffusely tender. No organomegaly or masses felt. Normal bowel sounds heard. Central nervous system:  No focal neurological deficits. Extremities: Symmetric 5 x 5 power. Skin: No rashes, lesions or ulcers Psychiatry: Confused, answers yes to all questions.    Data Reviewed: I have personally reviewed following labs and imaging studies  CBC:  Recent Labs Lab 04/17/17 2037 04/18/17 0500 04/19/17 0616 04/20/17 0600  WBC 11.4* 14.9* 17.7* 22.4*  NEUTROABS 8.8*  --   --   --   HGB 14.7 14.5 11.7* 11.9*  HCT 44.7 44.0 35.1* 35.3*  MCV 93.3 93.0 92.1 91.7  PLT 294 269 232 228   Basic Metabolic Panel:  Recent Labs Lab 04/17/17 2041 04/18/17 0500 04/19/17 0616 04/20/17 0600  NA 142 139 133* 138  K 3.8 4.0 3.5 3.5  CL 107 106 102 105  CO2 26 24 23 24   GLUCOSE 133* 126* 126* 136*  BUN 23* 20 16 13   CREATININE 1.28* 0.96 0.91 0.85  CALCIUM 9.4 8.9 8.1* 8.3*   GFR: CrCl cannot be calculated (Unknown ideal weight.). Liver Function Tests: No results for input(s): AST, ALT, ALKPHOS, BILITOT, PROT, ALBUMIN in the last 168 hours. No results for input(s): LIPASE, AMYLASE in the last 168 hours. No results for input(s): AMMONIA in the last 168 hours. Coagulation Profile:  Recent Labs Lab 04/17/17 2041  INR 1.00   Cardiac Enzymes: No results for  input(s): CKTOTAL, CKMB, CKMBINDEX, TROPONINI in the last 168 hours. BNP (last 3 results) No results for input(s): PROBNP in the last 8760 hours. HbA1C: No results for input(s): HGBA1C in the last 72 hours. CBG:  Recent Labs Lab 04/19/17 2058  GLUCAP 107*   Lipid Profile: No results for input(s): CHOL, HDL, LDLCALC, TRIG, CHOLHDL, LDLDIRECT in the last 72 hours. Thyroid Function Tests: No results for input(s): TSH, T4TOTAL, FREET4, T3FREE, THYROIDAB in the last 72 hours. Anemia Panel: No results for input(s): VITAMINB12, FOLATE, FERRITIN, TIBC, IRON, RETICCTPCT in the last 72 hours. Sepsis Labs: No results for input(s): PROCALCITON, LATICACIDVEN in the last 168 hours.  Recent Results (from the past 240 hour(s))  Urine culture     Status: None   Collection Time: 04/17/17  9:09 PM  Result Value Ref Range Status   Specimen Description URINE, RANDOM  Final   Special Requests NONE  Final   Culture   Final    NO GROWTH Performed at [facility], [address], [place], [place] [address]    Report Status 04/19/2017 FINAL  Final  Surgical PCR screen     Status: Abnormal   Collection Time: 04/17/17 11:54 PM  Result Value Ref Range Status   MRSA, PCR POSITIVE (A) NEGATIVE Final    Comment: RESULT CALLED TO, READ BACK BY AND VERIFIED WITH: DANIEL,C AT 0535 BY HUFFINES,S ON 04/18/17.    Staphylococcus aureus POSITIVE (A) NEGATIVE Final    Comment:        The Xpert SA Assay (FDA approved for NASAL specimens in patients over 67 years of age), is one component of a comprehensive surveillance program.  Test performance has been validated by [facility] for patients greater than or equal to 68 year old. It is not intended to diagnose infection nor to guide or monitor treatment. RESULT CALLED TO, READ BACK BY AND VERIFIED WITH: DANIEL,C AT 0535 BY HUFFINES,S ON 04/18/17.          Radiology Studies: No results found.      Scheduled Meds: . aspirin EC  325 mg  Oral Q breakfast  . Chlorhexidine Gluconate Cloth  6 each Topical Q0600  . docusate sodium  100 mg Oral BID  . donepezil  10 mg Oral QHS  . feeding supplement (ENSURE ENLIVE)  237 mL Oral BID BM  . metoprolol tartrate  12.5 mg Oral BID  . multivitamin with minerals  1 tablet Oral Daily  . mupirocin ointment  1 application Nasal BID   Continuous Infusions: . sodium chloride 75 mL/hr at 04/20/17 1533     LOS: 3 days    Time spent:    Illiana Losurdo, MD [facility] Pager [phone]  If 7PM-7AM, please contact night-coverage [email or web address] Password [email or web address] 04/20/2017, 4:09 PM

## 2017-04-21 ENCOUNTER — Encounter (HOSPITAL_COMMUNITY): Payer: Self-pay | Admitting: Orthopedic Surgery

## 2017-04-21 DIAGNOSIS — D72829 Elevated white blood cell count, unspecified: Secondary | ICD-10-CM

## 2017-04-21 LAB — TYPE AND SCREEN
ABO/RH(D): A POS
Antibody Screen: NEGATIVE
Unit division: 0
Unit division: 0

## 2017-04-21 LAB — BPAM RBC
Blood Product Expiration Date: 201806272359
Blood Product Expiration Date: 201806272359
UNIT TYPE AND RH: 6200
Unit Type and Rh: 6200

## 2017-04-21 LAB — CBC
HCT: 29.5 % — ABNORMAL LOW (ref 36.0–46.0)
Hemoglobin: 9.6 g/dL — ABNORMAL LOW (ref 12.0–15.0)
MCH: 30.5 pg (ref 26.0–34.0)
MCHC: 32.5 g/dL (ref 30.0–36.0)
MCV: 93.7 fL (ref 78.0–100.0)
PLATELETS: 244 10*3/uL (ref 150–400)
RBC: 3.15 MIL/uL — ABNORMAL LOW (ref 3.87–5.11)
RDW: 12.8 % (ref 11.5–15.5)
WBC: 16.2 10*3/uL — AB (ref 4.0–10.5)

## 2017-04-21 LAB — C DIFFICILE QUICK SCREEN W PCR REFLEX
C DIFFICLE (CDIFF) ANTIGEN: POSITIVE — AB
C Diff toxin: NEGATIVE

## 2017-04-21 LAB — LACTIC ACID, PLASMA
Lactic Acid, Venous: 1.1 mmol/L (ref 0.5–1.9)
Lactic Acid, Venous: 1 mmol/L (ref 0.5–1.9)

## 2017-04-21 MED ORDER — METOPROLOL TARTRATE 25 MG PO TABS
12.5000 mg | ORAL_TABLET | Freq: Once | ORAL | Status: AC
  Administered 2017-04-21: 12.5 mg via ORAL
  Filled 2017-04-21: qty 1

## 2017-04-21 MED ORDER — MILK AND MOLASSES ENEMA: 1.0000 | Freq: Once | RECTAL | Status: DC

## 2017-04-21 MED ORDER — POLYETHYLENE GLYCOL 3350 17 G PO PACK
17.0000 g | PACK | Freq: Two times a day (BID) | ORAL | Status: DC
  Administered 2017-04-21: 17 g via ORAL
  Filled 2017-04-21 (×2): qty 1

## 2017-04-21 MED ORDER — SODIUM CHLORIDE 0.9 % IV BOLUS (SEPSIS)
1000.0000 mL | Freq: Once | INTRAVENOUS | Status: AC
  Administered 2017-04-21: 1000 mL via INTRAVENOUS

## 2017-04-21 MED ORDER — SODIUM CHLORIDE 0.9 % IV BOLUS (SEPSIS)
500.0000 mL | Freq: Once | INTRAVENOUS | Status: AC
  Administered 2017-04-21: 500 mL via INTRAVENOUS

## 2017-04-21 NOTE — Care Management (Signed)
    Durable Medical Equipment        Start     Ordered   04/21/17 1451  For home use only DME Hospital bed  Once    Question Answer Comment  Patient has (list medical condition): hip fx   The above medical condition requires: Patient requires the ability to reposition frequently   Head must be elevated greater than: 30 degrees   Bed type Semi-electric   Trapeze Bar Yes      04/21/17 1451

## 2017-04-21 NOTE — Progress Notes (Signed)
PT Cancellation Note  Patient Details Name: Heather Baileyhyllis T Dobransky MRN: [id number] DOB: 1936/01/24   Cancelled Treatment:    Reason Eval/Treat Not Completed: Patient's level of consciousness. Chart reviewed, RN consulted. Attempted PT treatment but unable to awaken patient, despite repeated moderate physical stimulus of Rt shoulder and hand. Discussed PLOF with sister-in-law in room who reports patient has been very sleepy this morning and typically sleeps past 11a each day. HR noted to be 140-150s while in room, but going back down to 110-120s: RN aware. Will attempt treatment again at later date/time.   12:07 PM, 04/21/17 Rosamaria LintsAllan C Liese Dizdarevic, PT, DPT Physical Therapist - [place] [phone](ASCOM)  [phone] (Office)    Kelcey Korus C 04/21/2017, 12:01 PM

## 2017-04-21 NOTE — Progress Notes (Signed)
Initial Nutrition Assessment   INTERVENTION:  Ensure Enlive po BID, each supplement provides 350 kcal and 20 grams of protein   Mechanical soft diet   Staff to assist with tray set up and feeding   NUTRITION DIAGNOSIS:   Inadequate oral intake related to chronic illness (dementia) as evidenced by estimated needs, meal completion < 50%.    GOAL:   Patient will meet greater than or equal to 90% of their needs   MONITOR:   Supplement acceptance, Diet advancement, PO intake, Labs, Weight trends  REASON FOR ASSESSMENT:   Consult Hip fracture protocol  ASSESSMENT:   Pt is a 81 yo female from home. She has severe dementia and lives with her husband. She presents with left hip hx following a mechanical fall.  Ms Katrinka BlazingSmith and her husband have caregivers with them daily at home. She is able to feed herself but intake is limited. According to sister in law she has no wt changes and has always been a smaller person. She follows a regular diet at home. This morning she drank 100% of her milk and apple juice and ate 50% of grits and eggs. Needs assistance with feeding currently.  Partial nutrition focused exam completed: pt unable to participate. Restraints due to pulling out IV. There is no edema BLE or significant upper body fat depletions.  Recent Labs Lab 04/18/17 0500 04/19/17 0616 04/20/17 0600  NA 139 133* 138  K 4.0 3.5 3.5  CL 106 102 105  CO2 24 23 24   BUN 20 16 13   CREATININE 0.96 0.91 0.85  CALCIUM 8.9 8.1* 8.3*  GLUCOSE 126* 126* 136*   Labs: unremarkable  Meds: reviewed   Diet Order:  Diet regular Room service appropriate? Yes; Fluid consistency: Thin  Skin:   (surgical incision to left hip)  Last BM:  6/17 soft large stool  Height:   Ht Readings from Last 1 Encounters:  04/21/17 5\' 4"  (1.626 m)    Weight:   Wt Readings from Last 1 Encounters:  04/21/17 133 lb (60.3 kg)    Ideal Body Weight:  55 kg  BMI:  Body mass index is 22.83  kg/m.  Estimated Nutritional Needs:   Kcal:  [id number] (based on adj wt)  Protein:  43-49 gr  Fluid:  1.7-1.8 liters daily  EDUCATION NEEDS:   No education needs identified at this time ( pt has severe dementia)  Royann ShiversLynn Zachry Hopfensperger MS,RD,CSG,LDN Office: [phone] Pager: [phone]

## 2017-04-21 NOTE — Progress Notes (Signed)
PROGRESS NOTE    Heather Cline  ZOX:[id number] DOB: 1936-02-13 DOA: 04/17/2017 PCP: [name], MD    Brief Narrative:  81 year old female with a history of dementia was brought to the emergency room after having a mechanical fall and suffered a left hip fracture. She was seen by orthopedics and underwent operative management.   Assessment & Plan:   Principal Problem:   Closed displaced intertrochanteric fracture of left femur (HCC) Active Problems:   Fall at home, initial encounter   Severe dementia   Pyuria   CKD (chronic kidney disease) stage 3, GFR 30-59 ml/min   Fecal impaction (HCC)   Hip fracture (HCC)   1. Left hip fracture status post mechanical fall. Seen by orthopedics and underwent operative management. Will need further physical therapy. 2. Severe dementia. Continue on Aricept. 3. Constipation with fecal impaction. Patient did have bowel movement chest ray, but repeat CT scan showed persistent stool/impaction. Continue on bowel regimen. C. difficile was checked and found to be antigen positive. PCR is in process. 4. Chronic kidney disease stage III. Creatinine is currently at baseline. 5. Tachycardia. EKG shows sinus rhythm with PACs. Started on low-dose beta blockers with improvement of heart rate although blood pressure appears low. May need to discontinue if blood pressure continues to be low. On telemetry, possible atrial fibrillation. Will repeat EKG today.. 6. Leukocytosis. Etiology is unclear. The degree of leukocytosis is concerning.Since yesterday, WBC count has trended down. Chest x-ray did not show any evidence of pneumonia. CT scan of the abdomen and pelvis indicate retained stool, but no acute process. Lactic acid was elevated but has improved with IV hydration. Possibly related to dehydration. Continue IV hydration for now.   DVT prophylaxis: Aspirin Code Status: DNR, discussed with husband and daughter-in-law. Family Communication: discussed with  husband and daughter in law at bedside Disposition Plan: family has elected to take patient home with home health   Consultants:   Orthopedics  Procedures:   Left hip fracture INTRAMEDULLARY (IM) NAIL INTERTROCHANTRIC (Left)  Antimicrobials:   Rocephin 6/14>> 6/16   Subjective: Somnolent, does not answer questions  Objective: Vitals:   04/21/17 0428 04/21/17 0828 04/21/17 1131 04/21/17 1224  BP: (!) 95/42 (!) 84/51  (!) 91/44  Pulse: (!) 107 66  65  Resp:  20  19  Temp: 98.6 F (37 C) 98.3 F (36.8 C)  97.4 F (36.3 C)  TempSrc: Axillary Axillary  Axillary  SpO2: 97% 97%  96%  Weight:   60.3 kg (133 lb)   Height:   5\' 4"  (1.626 m)     Intake/Output Summary (Last 24 hours) at 04/21/17 1329 Last data filed at 04/21/17 1300  Gross per 24 hour  Intake             5110 ml  Output              300 ml  Net             4810 ml   Filed Weights   04/21/17 1131  Weight: 60.3 kg (133 lb)    Examination:  General exam: Appears calm and comfortable  Respiratory system: Clear to auscultation. Respiratory effort normal. Cardiovascular system: S1 & S2 heard, irregular. No JVD, murmurs, rubs, gallops or clicks. No pedal edema. Gastrointestinal system: Abdomen is nondistended, soft and Diffusely tender, grimaces to palpation. No organomegaly or masses felt. Normal bowel sounds heard. Central nervous system:  No focal neurological deficits. Extremities: Symmetric 5 x 5 power. Skin:  No rashes, lesions or ulcers Psychiatry: somnolent    Data Reviewed: I have personally reviewed following labs and imaging studies  CBC:  Recent Labs Lab 04/17/17 2037 04/18/17 0500 04/19/17 0616 04/20/17 0600 04/21/17 0451  WBC 11.4* 14.9* 17.7* 22.4* 16.2*  NEUTROABS 8.8*  --   --   --   --   HGB 14.7 14.5 11.7* 11.9* 9.6*  HCT 44.7 44.0 35.1* 35.3* 29.5*  MCV 93.3 93.0 92.1 91.7 93.7  PLT 294 269 232 228 244   Basic Metabolic Panel:  Recent Labs Lab 04/17/17 2041  04/18/17 0500 04/19/17 0616 04/20/17 0600  NA 142 139 133* 138  K 3.8 4.0 3.5 3.5  CL 107 106 102 105  CO2 26 24 23 24   GLUCOSE 133* 126* 126* 136*  BUN 23* 20 16 13   CREATININE 1.28* 0.96 0.91 0.85  CALCIUM 9.4 8.9 8.1* 8.3*   GFR: Estimated Creatinine Clearance: 46.3 mL/min (by C-G formula based on SCr of 0.85 mg/dL). Liver Function Tests: No results for input(s): AST, ALT, ALKPHOS, BILITOT, PROT, ALBUMIN in the last 168 hours. No results for input(s): LIPASE, AMYLASE in the last 168 hours. No results for input(s): AMMONIA in the last 168 hours. Coagulation Profile:  Recent Labs Lab 04/17/17 2041  INR 1.00   Cardiac Enzymes: No results for input(s): CKTOTAL, CKMB, CKMBINDEX, TROPONINI in the last 168 hours. BNP (last 3 results) No results for input(s): PROBNP in the last 8760 hours. HbA1C: No results for input(s): HGBA1C in the last 72 hours. CBG:  Recent Labs Lab 04/19/17 2058  GLUCAP 107*   Lipid Profile: No results for input(s): CHOL, HDL, LDLCALC, TRIG, CHOLHDL, LDLDIRECT in the last 72 hours. Thyroid Function Tests: No results for input(s): TSH, T4TOTAL, FREET4, T3FREE, THYROIDAB in the last 72 hours. Anemia Panel: No results for input(s): VITAMINB12, FOLATE, FERRITIN, TIBC, IRON, RETICCTPCT in the last 72 hours. Sepsis Labs:  Recent Labs Lab 04/20/17 1700 04/20/17 2035 04/21/17 0451 04/21/17 0711  LATICACIDVEN 2.9* 2.5* 1.1 1.0    Recent Results (from the past 240 hour(s))  Urine culture     Status: None   Collection Time: 04/17/17  9:09 PM  Result Value Ref Range Status   Specimen Description URINE, RANDOM  Final   Special Requests NONE  Final   Culture   Final    NO GROWTH Performed at [facility], [address] [place], [place] [address]    Report Status 04/19/2017 FINAL  Final  Surgical PCR screen     Status: Abnormal   Collection Time: 04/17/17 11:54 PM  Result Value Ref Range Status   MRSA, PCR POSITIVE (A) NEGATIVE Final     Comment: RESULT CALLED TO, READ BACK BY AND VERIFIED WITH: DANIEL,C AT 0535 BY HUFFINES,S ON 04/18/17.    Staphylococcus aureus POSITIVE (A) NEGATIVE Final    Comment:        The Xpert SA Assay (FDA approved for NASAL specimens in patients over 921 years of age), is one component of a comprehensive surveillance program.  Test performance has been validated by [facility] for patients greater than or equal to 79 year old. It is not intended to diagnose infection nor to guide or monitor treatment. RESULT CALLED TO, READ BACK BY AND VERIFIED WITH: DANIEL,C AT 0535 BY HUFFINES,S ON 04/18/17.   C difficile quick scan w PCR reflex     Status: Abnormal   Collection Time: 04/20/17  9:15 AM  Result Value Ref Range Status  C Diff antigen POSITIVE (A) NEGATIVE Final   C Diff toxin NEGATIVE NEGATIVE Final   C Diff interpretation Results are indeterminate. See PCR results.  Final         Radiology Studies: Ct Abdomen Pelvis W Contrast  Result Date: 04/20/2017 CLINICAL DATA:  81 year old female a above presenting with diarrhea and lower abdominal pain. History of prior left hip fracture. EXAM: CT ABDOMEN AND PELVIS WITH CONTRAST TECHNIQUE: Multidetector CT imaging of the abdomen and pelvis was performed using the standard protocol following bolus administration of intravenous contrast. CONTRAST:  <See Chart> ISOVUE-300 IOPAMIDOL (ISOVUE-300) INJECTION 61%, ISOVUE-300 IOPAMIDOL (ISOVUE-300) INJECTION 61% COMPARISON:  Pelvic radiograph dated 04/17/2017 FINDINGS: Lower chest: There are bibasilar atelectatic changes. The visualized lung bases are otherwise clear. No intra-abdominal free air or free fluid. Hepatobiliary: Multifocal small hypodense liver lesions measuring up to 1.6 cm in the left lobe of the liver, incompletely characterized, likely cysts and less likely hemangioma. There is no intrahepatic biliary ductal dilatation. No calcified gallstone or pericholecystic fluid. Pancreas:  Unremarkable. No pancreatic ductal dilatation or surrounding inflammatory changes. Spleen: Normal in size without focal abnormality. Adrenals/Urinary Tract: The adrenal glands are unremarkable. There is a 2.5 cm left renal upper pole cyst as well as small left renal parapelvic cysts. A 4 mm left renal interpolar stone. There is no hydronephrosis on either side. 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 The urinary bladder is unremarkable. Stomach/Bowel: There is extensive sigmoid diverticulosis without active inflammatory changes. Large stool ball noted at the rectal vault which may represent a degree of impaction. There is no evidence of bowel obstruction or active inflammation. Normal appendix. Vascular/Lymphatic: There is aortoiliac atherosclerotic disease. The IVC is unremarkable. The splenic vein, SMV, and main portal vein are patent. There is no portal venous gas. New no adenopathy. Reproductive: The uterus and ovaries are grossly unremarkable. No pelvic mass. Other: None Musculoskeletal: There is degenerative changes of the spine. Comminuted left femoral neck fracture status post ORIF. Postsurgical changes of the left hip with small amount of fluid and small pockets of air in the soft tissues of the left hip as well as cutaneous staples. No fluid collection or abscess. IMPRESSION: 1. Large stool within the rectum concerning for fecal impaction. Clinical correlation is recommended. No bowel obstruction. Normal appendix. 2. Extensive sigmoid diverticulosis without active inflammation. 3. A 4 mm nonobstructing left renal calculus.  No hydronephrosis. 4. Comminuted fracture of the left femoral neck status post ORIF. 5.  Aortic Atherosclerosis (ICD10-I70.0). Electronically Signed   By: Elgie Collard M.D.   On: 04/20/2017 23:52   Dg Chest Port 1 View  Result Date: 04/20/2017 CLINICAL DATA:  Fever. EXAM: PORTABLE CHEST 1 VIEW COMPARISON:  Chest radiograph 04/17/2017  FINDINGS: Lower lung volumes from prior exam with mild dependent atelectasis. Crowding of bronchovascular structures. There is a stable heart size and mediastinal contours with atherosclerosis of the thoracic aorta. Minimal blunting of left costophrenic angle is likely secondary to epicardial fat pad as seen on concurrent CT, no definite pleural fluid. No confluent airspace disease. No pneumothorax. Remote left rib fractures. IMPRESSION: Low lung volumes with mild dependent atelectasis. Electronically Signed   By: Rubye Oaks M.D.   On: 04/20/2017 23:28        Scheduled Meds: . aspirin EC  325 mg Oral Q breakfast  . Chlorhexidine Gluconate Cloth  6 each Topical Q0600  . docusate sodium  100 mg Oral BID  . donepezil  10  mg Oral QHS  . feeding supplement (ENSURE ENLIVE)  237 mL Oral BID BM  . metoprolol tartrate  12.5 mg Oral BID  . milk and molasses  1 enema Rectal Once  . multivitamin with minerals  1 tablet Oral Daily  . mupirocin ointment  1 application Nasal BID  . polyethylene glycol  17 g Oral BID   Continuous Infusions: . sodium chloride 75 mL/hr at 04/21/17 0522  . sodium chloride       LOS: 4 days    Time spent:    Xochilth Standish, MD [facility] Pager [phone]  If 7PM-7AM, please contact night-coverage [email or web address] Password [email or web address] 04/21/2017, 1:29 PM

## 2017-04-21 NOTE — Evaluation (Signed)
Occupational Therapy Evaluation Patient Details Name: Heather Cline MRN: [id number] DOB: 10-Sep-1936 Today's Date: 04/21/2017    History of Present Illness Heather Baileyhyllis T Onstad is a 81 y.o. female with history of severe dementia but no other medical conditions, takes only Aricept.  Pt fell at home today , in ED xrays show L intertrochanteric fracture of the left femur.   Clinical Impression   Pt resting in bed upon therapy arrival. Family present during OT evaluation and was able to provide background information. Due to patient's previously receiving total assist for ADL tasks I don't recommend any OT services at discharge as patient will continue to receive assistance when she returns home.     Follow Up Recommendations  No OT follow up    Equipment Recommendations  None recommended by OT       Precautions / Restrictions Precautions Precautions: Fall Precaution Comments: due to mechanical fall at home and recent hip surgery Restrictions Weight Bearing Restrictions: Yes LLE Weight Bearing: Weight bearing as tolerated      Mobility Bed Mobility Overal bed mobility: Needs Assistance Bed Mobility: Supine to Sit;Sit to Supine     Supine to sit: Total assist;+2 for safety/equipment;HOB elevated Sit to supine: Total assist;+2 for safety/equipment   General bed mobility comments: Once patient was seated on EOB she was able to sit independently.  Transfers Overall transfer level: Needs assistance Equipment used: Rolling walker (2 wheeled) Transfers: Sit to/from Stand Sit to Stand: Min assist;+2 safety/equipment         General transfer comment: Pt required an initial physical cue to start task of standing although she was able to complete task and stand along side of bed holding RW at [employer] for safety. No LOB while standing. Pt was able to tolerate standing for 5 minutes with therapist.        ADL either performed or assessed with clinical judgement   ADL Overall ADL's  : At baseline         General ADL Comments: Pt receives Total Assistance for basic ADL tasks at home due to advanced dementia. Patient is at baseline.     Vision Baseline Vision/History:  (No deficits noted during evaluation)              Pertinent Vitals/Pain Pain Assessment: Faces Faces Pain Scale: Hurts little more Pain Location: left hip Pain Descriptors / Indicators: Grimacing Pain Intervention(s): Monitored during session;Repositioned;Limited activity within patient's tolerance     Hand Dominance Right   Extremity/Trunk Assessment Upper Extremity Assessment Upper Extremity Assessment: Overall WFL for tasks assessed           Communication Communication Communication: Other (comment) (Advanced dementia. Patient communicates periodically. Able to say what hurts. )   Cognition Arousal/Alertness: Awake/alert Behavior During Therapy: WFL for tasks assessed/performed Overall Cognitive Status: History of cognitive impairments - at baseline                  Home Living Family/patient expects to be discharged to:: Skilled nursing facility Living Arrangements: Spouse/significant other                               Additional Comments: Patient received assistance from 2 aides. She received 24 hour supervision and assistance for all tasks. Pt typically sleeps until 11AM.      Prior Functioning/Environment Level of Independence: Needs assistance  Gait / Transfers Assistance Needed: Pt wandered and did not use any assistive  device. ADL's / Homemaking Assistance Needed: Patient was total assist for all tasks due to history of dementia. Family mentions that she does like to help out with tasks at home. She fell when she was helping changing the sheets on the bed with her aide.                                     AM-PAC PT "6 Clicks" Daily Activity     Outcome Measure Help from another person eating meals?: Total Help from another person  taking care of personal grooming?: Total Help from another person toileting, which includes using toliet, bedpan, or urinal?: Total Help from another person bathing (including washing, rinsing, drying)?: Total Help from another person to put on and taking off regular upper body clothing?: Total Help from another person to put on and taking off regular lower body clothing?: Total 6 Click Score: 6   End of Session Equipment Utilized During Treatment: Gait belt;Rolling walker Nurse Communication: Other (comment) (Let Nurse tech be aware of wet bedding from IV)  Activity Tolerance: Patient tolerated treatment well Patient left: in bed;with call bell/phone within reach;with bed alarm set;with nursing/sitter in room;with family/visitor present;Other (comment) Chiropractor)  OT Visit Diagnosis: Muscle weakness (generalized) (M62.81)                Time: 0900-0930 OT Time Calculation (min): 30 min Charges:  OT General Charges $OT Visit: 1 Procedure OT Evaluation $OT Eval Low Complexity: 1 Procedure G-Codes: OT G-codes **NOT FOR INPATIENT CLASS** Functional Assessment Tool Used: AM-PAC 6 Clicks Daily Activity Functional Limitation: Self care Self Care Current Status (N8295): 100 percent impaired, limited or restricted Self Care Goal Status (A2130): 100 percent impaired, limited or restricted Self Care Discharge Status (Q6578): 100 percent impaired, limited or restricted   Limmie Patricia, OTR/L,CBIS  [phone]   Hero Kulish, Vernona Rieger D 04/21/2017, 10:07 AM

## 2017-04-21 NOTE — Progress Notes (Signed)
Critical Lab: Lactic Acid 2.5. MD made aware. No new orders at this time. Will continue to monitor patient.

## 2017-04-21 NOTE — Clinical Social Work Note (Signed)
Clinical Social Work Assessment  Patient Details  Name: Heather Cline MRN: [id number] Date of Birth: June 09, 1936  Date of referral:  04/21/17               Reason for consult:  Facility Placement, Discharge Planning                Permission sought to share information with:  Case Manager, Magazine features editoracility Contact Representative, Family Supports Permission granted to share information::  Yes, Verbal Permission Granted  Name::        Agency::     Relationship::  Sister in law: Garment/textile technologistMaryanne (at the bedside) husband also involved Heather Cline(Heather Cline)  Contact Information:     Housing/Transportation Living arrangements for the past 2 months:  Single Family Home Source of Information:  Medical Team, Case Manager, Other (Comment Required) (other family members) Patient Interpreter Needed:  None Criminal Activity/Legal Involvement Pertinent to Current Situation/Hospitalization:  No - Comment as needed Significant Relationships:  Other Family Members, Spouse, Merchandiser, retailCommunity Support (private sitters 2) Lives with:  Spouse Do you feel safe going back to the place where you live?  Yes Need for family participation in patient care:  Yes (Comment) (severe dementia)  Care giving concerns:  Patient coming from home after she had a fall when making up her bed.  Patient at baseline has severe dementia and currently in mitts.  She requires 24 hour supervision and has 2 private sitters 24/7 that stay with patient and her husband. Husband also in the home Heather Cline(Heather Cline) who is also having medical issues and going to have surgery in the coming weeks.  Sister in law complete hx/ CSW assessment at the bedside.  Reports patient was independent prior to admission and doing well at home, other than her dementia.  Sister in law: Heather BaliMary anne reports she will take patient home, (actiing as HCPOA as husband has been sick) and wants her placed back in her home environment.    Social Worker assessment / plan:  LCSW completed assessment as consulted  for SNF placement/discharge planning.  Discussed planning with sister in law and recommendations for SNF in which she is declining stating patient will return home with 24/7 sitters who have been working with patient for some time.  Heather Cline reports PT can come to the home, but due to dementia and behaviors would not like patient placed at this time.    Plan: DC home with Home health per family request.  Employment status:  Retired Database administratornsurance information:  Managed Medicare PT Recommendations:  Skilled Nursing Facility, 24 Hour Supervision Information / Referral to community resources:  Skilled Nursing Facility  Patient/Family's Response to care:  Family understanding of recommendations but reports home would be better fit for patient.  Patient/Family's Understanding of and Emotional Response to Diagnosis, Current Treatment, and Prognosis:  Heather LargeMaryanne reports she is very familiar with placement process after a fall as she has helped her mother in the past.  Due to dementia and behaviors, she is requesting for patient to go home in her familiar environment.   Emotional Assessment Appearance:  Appears stated age Attitude/Demeanor/Rapport:  Unable to Assess (very confused, combative. Currently in mitts) Affect (typically observed):  Agitated Orientation:  Oriented to Self Alcohol / Substance use:  Not Applicable Psych involvement (Current and /or in the community):  No (Comment)  Discharge Needs  Concerns to be addressed:  No discharge needs identified Readmission within the last 30 days:  No Current discharge risk:  None Barriers to Discharge:  Continued Medical  Work up   [employer], LCSW 04/21/2017, 9:22 AM

## 2017-04-21 NOTE — Progress Notes (Signed)
SLP Cancellation Note  Patient Details Name: Heather Cline MRN: [id number] DOB: [birth year]   Cancelled treatment:       Reason Eval/Treat Not Completed: Patient's level of consciousness; Pt only opened eyes briefly and verbally refused all po. Sitter reported that Pt did not consume her evening meal, but did have breakfast and lunch. SLP will re-attempt tomorrow AM.  Thank you,  Heather MorosDabney [name], CCC-SLP [phone]    Heather Cline 04/21/2017, 6:59 PM

## 2017-04-22 ENCOUNTER — Inpatient Hospital Stay (HOSPITAL_COMMUNITY): Payer: Medicare Other

## 2017-04-22 LAB — COMPREHENSIVE METABOLIC PANEL
ALBUMIN: 1.9 g/dL — AB (ref 3.5–5.0)
ALT: 18 U/L (ref 14–54)
AST: 28 U/L (ref 15–41)
Alkaline Phosphatase: 47 U/L (ref 38–126)
Anion gap: 5 (ref 5–15)
BUN: 11 mg/dL (ref 6–20)
CHLORIDE: 106 mmol/L (ref 101–111)
CO2: 25 mmol/L (ref 22–32)
CREATININE: 0.63 mg/dL (ref 0.44–1.00)
Calcium: 7.5 mg/dL — ABNORMAL LOW (ref 8.9–10.3)
GFR calc Af Amer: >60 mL/min (ref >60)
GFR calc non Af Amer: >60 mL/min (ref >60)
GLUCOSE: 129 mg/dL — AB (ref 65–99)
POTASSIUM: 2.9 mmol/L — AB (ref 3.5–5.1)
SODIUM: 136 mmol/L (ref 135–145)
Total Bilirubin: 0.7 mg/dL (ref 0.3–1.2)
Total Protein: 4.6 g/dL — ABNORMAL LOW (ref 6.5–8.1)

## 2017-04-22 LAB — GASTROINTESTINAL PANEL BY PCR, STOOL (REPLACES STOOL CULTURE)
ASTROVIRUS: NOT DETECTED
Adenovirus F40/41: NOT DETECTED
CAMPYLOBACTER SPECIES: NOT DETECTED
CYCLOSPORA CAYETANENSIS: NOT DETECTED
Cryptosporidium: NOT DETECTED
ENTEROPATHOGENIC E COLI (EPEC): NOT DETECTED
ENTEROTOXIGENIC E COLI (ETEC): NOT DETECTED
Entamoeba histolytica: NOT DETECTED
Enteroaggregative E coli (EAEC): NOT DETECTED
Giardia lamblia: NOT DETECTED
NOROVIRUS GI/GII: NOT DETECTED
PLESIMONAS SHIGELLOIDES: NOT DETECTED
ROTAVIRUS A: NOT DETECTED
SAPOVIRUS (I, II, IV, AND V): NOT DETECTED
SHIGA LIKE TOXIN PRODUCING E COLI (STEC): NOT DETECTED
Salmonella species: NOT DETECTED
Shigella/Enteroinvasive E coli (EIEC): NOT DETECTED
VIBRIO SPECIES: NOT DETECTED
Vibrio cholerae: NOT DETECTED
Yersinia enterocolitica: NOT DETECTED

## 2017-04-22 LAB — CBC WITH DIFFERENTIAL/PLATELET
Basophils Absolute: 0 10*3/uL (ref 0.0–0.1)
Basophils Relative: 0 %
EOS ABS: 0.2 10*3/uL (ref 0.0–0.7)
EOS PCT: 1 %
HCT: 25.5 % — ABNORMAL LOW (ref 36.0–46.0)
Hemoglobin: 8.5 g/dL — ABNORMAL LOW (ref 12.0–15.0)
LYMPHS ABS: 1.2 10*3/uL (ref 0.7–4.0)
Lymphocytes Relative: 10 %
MCH: 31.4 pg (ref 26.0–34.0)
MCHC: 33.3 g/dL (ref 30.0–36.0)
MCV: 94.1 fL (ref 78.0–100.0)
MONOS PCT: 8 %
Monocytes Absolute: 1 10*3/uL (ref 0.1–1.0)
Neutro Abs: 10.5 10*3/uL — ABNORMAL HIGH (ref 1.7–7.7)
Neutrophils Relative %: 81 %
PLATELETS: 271 10*3/uL (ref 150–400)
RBC: 2.71 MIL/uL — ABNORMAL LOW (ref 3.87–5.11)
RDW: 13 % (ref 11.5–15.5)
WBC: 12.9 10*3/uL — ABNORMAL HIGH (ref 4.0–10.5)

## 2017-04-22 LAB — RETICULOCYTES
RBC.: 2.82 MIL/uL — ABNORMAL LOW (ref 3.87–5.11)
RETIC CT PCT: 3.2 % — AB (ref 0.4–3.1)
Retic Count, Absolute: 90.2 10*3/uL (ref 19.0–186.0)

## 2017-04-22 LAB — IRON AND TIBC
Iron: 22 ug/dL — ABNORMAL LOW (ref 28–170)
SATURATION RATIOS: 13 % (ref 10.4–31.8)
TIBC: 168 ug/dL — ABNORMAL LOW (ref 250–450)
UIBC: 146 ug/dL

## 2017-04-22 LAB — FERRITIN: FERRITIN: 203 ng/mL (ref 11–307)

## 2017-04-22 LAB — OCCULT BLOOD X 1 CARD TO LAB, STOOL: Fecal Occult Bld: NEGATIVE

## 2017-04-22 LAB — MAGNESIUM: Magnesium: 1.8 mg/dL (ref 1.7–2.4)

## 2017-04-22 LAB — VITAMIN B12: Vitamin B-12: 390 pg/mL (ref 180–914)

## 2017-04-22 LAB — FOLATE: FOLATE: 12.5 ng/mL (ref >5.9)

## 2017-04-22 MED ORDER — POTASSIUM CHLORIDE 10 MEQ/100ML IV SOLN
10.0000 meq | INTRAVENOUS | Status: AC
  Administered 2017-04-22 (×6): 10 meq via INTRAVENOUS
  Filled 2017-04-22 (×6): qty 100

## 2017-04-22 NOTE — Progress Notes (Signed)
Physical Therapy Treatment Patient Details Name: Heather Cline MRN: [id number] DOB: Sep 26, 1936 Today's Date: 04/22/2017    History of Present Illness Heather Cline is a 81 y.o. female with history of severe dementia, ambulatory but requring total care at home, no other medical conditions, takes only Aricept.  Pt fell at home today , in ED xrays show L intertrochanteric fracture of the left femur. Pt is now s/p Left hip ORIF and WBAT.     PT Comments    Pt received semirecumbent in bed asleep, but easily rousable with gentle tactile and verbal stimulus. Pt is with flat affect, making inimal eye contact, not responsive to attempts of verbal communication, visual cues, or tactile cues. Pt allows for P/ROM to be performed on the RLE, without guarding, grimacing, assistance or resistance. Few signs demonstrate any awareness that PT is assisting with P/ROM or in room at all. Pt presenting as described as famility with strong language deficits related to dementia, otherwise pleasant, no acute distress. The patient is unable to participate with PT at this time, not able to follow simple commands. Pt will benefit from continued P/ROM and positioning from nursing staff. No skilled PT serviced appropriate at this time. PT recommending 24/7 care and assistance with all mobility. Family is agreeable to this per care management. PT signing off.      Follow Up Recommendations  Supervision for mobility/OOB;Supervision/Assistance - 24 hour     Equipment Recommendations  None recommended by PT    Recommendations for Other Services       Precautions / Restrictions Precautions Precautions: Fall Precaution Comments: due to mechanical fall at home and recent hip surgery Restrictions LLE Weight Bearing: Weight bearing as tolerated    Mobility  Bed Mobility Overal bed mobility:  (not attempted, pt minimally interactve, and not following commands)                Transfers                     Ambulation/Gait                 Stairs            Wheelchair Mobility    Modified Rankin (Stroke Patients Only)       Balance                                            Cognition Arousal/Alertness:  (somnolent, easily rousable) Behavior During Therapy: Flat affect                                          Exercises General Exercises - Lower Extremity Ankle Circles/Pumps: PROM;Right;15 reps;Supine Short Arc Quad: PROM;Right;15 reps;Supine Heel Slides: PROM;Right;15 reps;Supine Hip ABduction/ADduction: PROM;Right;15 reps;Supine    General Comments        Pertinent Vitals/Pain Pain Assessment:  (heavy language deficits; no grimacing or any facial response or libm withdrawl during P/ROM) Pain Intervention(s): Monitored during session    Home Living                      Prior Function            PT Goals (current goals can now be found in the care plan  section) Progress towards PT goals: Not progressing toward goals - comment    Frequency           PT Plan Discharge plan needs to be updated    Co-evaluation              AM-PAC PT "6 Clicks" Daily Activity  Outcome Measure  Difficulty turning over in bed (including adjusting bedclothes, sheets and blankets)?: Total Difficulty moving from lying on back to sitting on the side of the bed? : Total Difficulty sitting down on and standing up from a chair with arms (e.g., wheelchair, bedside commode, etc,.)?: Total Help needed moving to and from a bed to chair (including a wheelchair)?: Total Help needed walking in hospital room?: Total Help needed climbing 3-5 steps with a railing? : Total 6 Click Score: 6    End of Session   Activity Tolerance: Patient tolerated treatment well;Other (comment) (advanced demetia c communication deficits. ) Patient left: in bed;with call bell/phone within reach;with bed alarm set Nurse Communication:  (pt is  with BM in bed) PT Visit Diagnosis: Difficulty in walking, not elsewhere classified (R26.2);Pain     Time: [id number] PT Time Calculation (min) (ACUTE ONLY): 9 min  Charges:  $Therapeutic Activity: 8-22 mins                    G Codes:       12:23 PM, 04/22/17 Rosamaria LintsAllan C Buccola, PT, DPT Physical Therapist - [place] [phone](ASCOM)  [phone] (Office)    Buccola,Allan C 04/22/2017, 12:19 PM

## 2017-04-22 NOTE — Progress Notes (Signed)
Family at bedside. Family requested meal to feed patient. Pt ate 50% of dinner and drank 480 mL of fluid including an Ensure and Frosty from [employer].

## 2017-04-22 NOTE — Progress Notes (Signed)
PROGRESS NOTE    Heather Cline  ZOX:[id number] DOB: 24-Sep-1936 DOA: 04/17/2017 PCP: [name], MD    Brief Narrative:  81 year old female with a history of dementia was brought to the emergency room after having a mechanical fall and suffered a left hip fracture. She was seen by orthopedics and underwent operative management. Post operatively, she had rise in WBC count, which may be related to hemoconcentration. This has been trending down with IV fluids. Hemoglobin has also been trending down, possibly hemodilution and related to surgery. Continue to follow hemoglobin. Possible discharge home in next 24 hours if labs stable.   Assessment & Plan:   Principal Problem:   Closed displaced intertrochanteric fracture of left femur (HCC) Active Problems:   Fall at home, initial encounter   Severe dementia   Pyuria   CKD (chronic kidney disease) stage 3, GFR 30-59 ml/min   Fecal impaction (HCC)   Hip fracture (HCC)   1. Left hip fracture status post mechanical fall. Seen by orthopedics and underwent operative management. Will need further physical therapy. 2. Severe dementia. Continue on Aricept. 3. Constipation with fecal impaction. Patient is having some bowel movements, but repeat CT scan showed persistent stool/impaction. Continue on bowel regimen. C. difficile was checked and found to be antigen positive. PCR is in process, although suspect this will be negative. Repeat abdominal xray today. 4. Chronic kidney disease stage III. Creatinine is currently at baseline. 5. Tachycardia. EKG shows sinus rhythm with PACs. Started on low-dose beta blockers with improvement of heart rate although blood pressure appears low. May need to discontinue if blood pressure continues to be low. Blood pressures typically higher with manual checks. 6. Leukocytosis. Etiology is unclear. The degree of leukocytosis was concerning. WBC count now trending down. Chest x-ray did not show any evidence of  pneumonia. CT scan of the abdomen and pelvis indicate retained stool, but no acute process. Lactic acid was elevated but has improved with IV hydration. Possibly related to dehydration. Continue IV hydration for now. 7. Anemia. Patient has had a steady decline in hemoglobin since admission. Some may be blood loss from surgery/hemodilution from fluids. Check anemia panel and fobt. Possible transfusion tomorrow if hemoglobin continues to decline.   DVT prophylaxis: Aspirin Code Status: DNR Family Communication: discussed with daughter in law at bedside Disposition Plan: family has elected to take patient home with home health   Consultants:   Orthopedics  Procedures:   Left hip fracture INTRAMEDULLARY (IM) NAIL INTERTROCHANTRIC (Left)  Antimicrobials:   Rocephin 6/14>> 6/16   Subjective: Sleeping, difficult to wake up  Objective: Vitals:   04/21/17 1624 04/21/17 1800 04/21/17 2100 04/22/17 0648  BP: (!) 94/38 106/68 (!) 112/48 (!) 105/55  Pulse: (!) 59  83 (!) 111  Resp: 18  18 18   Temp: 98 F (36.7 C)  97.7 F (36.5 C) 97.7 F (36.5 C)  TempSrc: Axillary  Axillary Axillary  SpO2: 96%  97% 99%  Weight:      Height:        Intake/Output Summary (Last 24 hours) at 04/22/17 1123 Last data filed at 04/22/17 0652  Gross per 24 hour  Intake             3405 ml  Output                0 ml  Net             3405 ml   Filed Weights   04/21/17 1131  Weight: 60.3 kg (133 lb)    Examination:  General exam: Appears calm and comfortable  Respiratory system: Clear to auscultation. Respiratory effort normal. Cardiovascular system: S1 & S2 heard, irregular. No JVD, murmurs, rubs, gallops or clicks. No pedal edema. Gastrointestinal system: Abdomen is nondistended, soft and Diffusely tender, grimaces to palpation. No organomegaly or masses felt. Normal bowel sounds heard. Central nervous system:  No focal neurological deficits. Extremities: Symmetric 5 x 5 power. Skin: No  rashes, lesions or ulcers Psychiatry: somnolent    Data Reviewed: I have personally reviewed following labs and imaging studies  CBC:  Recent Labs Lab 04/17/17 2037 04/18/17 0500 04/19/17 0616 04/20/17 0600 04/21/17 0451 04/22/17 0428  WBC 11.4* 14.9* 17.7* 22.4* 16.2* 12.9*  NEUTROABS 8.8*  --   --   --   --  10.5*  HGB 14.7 14.5 11.7* 11.9* 9.6* 8.5*  HCT 44.7 44.0 35.1* 35.3* 29.5* 25.5*  MCV 93.3 93.0 92.1 91.7 93.7 94.1  PLT 294 269 232 228 244 271   Basic Metabolic Panel:  Recent Labs Lab 04/17/17 2041 04/18/17 0500 04/19/17 0616 04/20/17 0600 04/22/17 0428  NA 142 139 133* 138 136  K 3.8 4.0 3.5 3.5 2.9*  CL 107 106 102 105 106  CO2 26 24 23 24 25   GLUCOSE 133* 126* 126* 136* 129*  BUN 23* 20 16 13 11   CREATININE 1.28* 0.96 0.91 0.85 0.63  CALCIUM 9.4 8.9 8.1* 8.3* 7.5*   GFR: Estimated Creatinine Clearance: 49.2 mL/min (by C-G formula based on SCr of 0.63 mg/dL). Liver Function Tests:  Recent Labs Lab 04/22/17 0428  AST 28  ALT 18  ALKPHOS 47  BILITOT 0.7  PROT 4.6*  ALBUMIN 1.9*   No results for input(s): LIPASE, AMYLASE in the last 168 hours. No results for input(s): AMMONIA in the last 168 hours. Coagulation Profile:  Recent Labs Lab 04/17/17 2041  INR 1.00   Cardiac Enzymes: No results for input(s): CKTOTAL, CKMB, CKMBINDEX, TROPONINI in the last 168 hours. BNP (last 3 results) No results for input(s): PROBNP in the last 8760 hours. HbA1C: No results for input(s): HGBA1C in the last 72 hours. CBG:  Recent Labs Lab 04/19/17 2058  GLUCAP 107*   Lipid Profile: No results for input(s): CHOL, HDL, LDLCALC, TRIG, CHOLHDL, LDLDIRECT in the last 72 hours. Thyroid Function Tests: No results for input(s): TSH, T4TOTAL, FREET4, T3FREE, THYROIDAB in the last 72 hours. Anemia Panel: No results for input(s): VITAMINB12, FOLATE, FERRITIN, TIBC, IRON, RETICCTPCT in the last 72 hours. Sepsis Labs:  Recent Labs Lab 04/20/17 1700  04/20/17 2035 04/21/17 0451 04/21/17 0711  LATICACIDVEN 2.9* 2.5* 1.1 1.0    Recent Results (from the past 240 hour(s))  Urine culture     Status: None   Collection Time: 04/17/17  9:09 PM  Result Value Ref Range Status   Specimen Description URINE, RANDOM  Final   Special Requests NONE  Final   Culture   Final    NO GROWTH Performed at [facility], [address], [place], [place] [address]    Report Status 04/19/2017 FINAL  Final  Surgical PCR screen     Status: Abnormal   Collection Time: 04/17/17 11:54 PM  Result Value Ref Range Status   MRSA, PCR POSITIVE (A) NEGATIVE Final    Comment: RESULT CALLED TO, READ BACK BY AND VERIFIED WITH: DANIEL,C AT 0535 BY HUFFINES,S ON 04/18/17.    Staphylococcus aureus POSITIVE (A) NEGATIVE Final    Comment:  The Xpert SA Assay (FDA approved for NASAL specimens in patients over 79 years of age), is one component of a comprehensive surveillance program.  Test performance has been validated by [facility] for patients greater than or equal to 79 year old. It is not intended to diagnose infection nor to guide or monitor treatment. RESULT CALLED TO, READ BACK BY AND VERIFIED WITH: DANIEL,C AT 0535 BY HUFFINES,S ON 04/18/17.   C difficile quick scan w PCR reflex     Status: Abnormal   Collection Time: 04/20/17  9:15 AM  Result Value Ref Range Status   C Diff antigen POSITIVE (A) NEGATIVE Final   C Diff toxin NEGATIVE NEGATIVE Final   C Diff interpretation Results are indeterminate. See PCR results.  Final         Radiology Studies: Ct Abdomen Pelvis W Contrast  Result Date: 04/20/2017 CLINICAL DATA:  81 year old female a above presenting with diarrhea and lower abdominal pain. History of prior left hip fracture. EXAM: CT ABDOMEN AND PELVIS WITH CONTRAST TECHNIQUE: Multidetector CT imaging of the abdomen and pelvis was performed using the standard protocol following bolus administration of intravenous contrast.  CONTRAST:  <See Chart> ISOVUE-300 IOPAMIDOL (ISOVUE-300) INJECTION 61%, 100mL ISOVUE-300 IOPAMIDOL (ISOVUE-300) INJECTION 61% COMPARISON:  Pelvic radiograph dated 04/17/2017 FINDINGS: Lower chest: There are bibasilar atelectatic changes. The visualized lung bases are otherwise clear. No intra-abdominal free air or free fluid. Hepatobiliary: Multifocal small hypodense liver lesions measuring up to 1.6 cm in the left lobe of the liver, incompletely characterized, likely cysts and less likely hemangioma. There is no intrahepatic biliary ductal dilatation. No calcified gallstone or pericholecystic fluid. Pancreas: Unremarkable. No pancreatic ductal dilatation or surrounding inflammatory changes. Spleen: Normal in size without focal abnormality. Adrenals/Urinary Tract: The adrenal glands are unremarkable. There is a 2.5 cm left renal upper pole cyst as well as small left renal parapelvic cysts. A 4 mm left renal interpolar stone. There is no hydronephrosis on either side. 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 The urinary bladder is unremarkable. Stomach/Bowel: There is extensive sigmoid diverticulosis without active inflammatory changes. Large stool ball noted at the rectal vault which may represent a degree of impaction. There is no evidence of bowel obstruction or active inflammation. Normal appendix. Vascular/Lymphatic: There is aortoiliac atherosclerotic disease. The IVC is unremarkable. The splenic vein, SMV, and main portal vein are patent. There is no portal venous gas. New no adenopathy. Reproductive: The uterus and ovaries are grossly unremarkable. No pelvic mass. Other: None Musculoskeletal: There is degenerative changes of the spine. Comminuted left femoral neck fracture status post ORIF. Postsurgical changes of the left hip with small amount of fluid and small pockets of air in the soft tissues of the left hip as well as cutaneous staples. No fluid collection or  abscess. IMPRESSION: 1. Large stool within the rectum concerning for fecal impaction. Clinical correlation is recommended. No bowel obstruction. Normal appendix. 2. Extensive sigmoid diverticulosis without active inflammation. 3. A 4 mm nonobstructing left renal calculus.  No hydronephrosis. 4. Comminuted fracture of the left femoral neck status post ORIF. 5.  Aortic Atherosclerosis (ICD10-I70.0). Electronically Signed   By: Elgie CollardArash  Radparvar M.D.   On: 04/20/2017 23:52   Dg Chest Port 1 View  Result Date: 04/20/2017 CLINICAL DATA:  Fever. EXAM: PORTABLE CHEST 1 VIEW COMPARISON:  Chest radiograph 04/17/2017 FINDINGS: Lower lung volumes from prior exam with mild dependent atelectasis. Crowding of bronchovascular structures. There is a stable heart size and mediastinal  contours with atherosclerosis of the thoracic aorta. Minimal blunting of left costophrenic angle is likely secondary to epicardial fat pad as seen on concurrent CT, no definite pleural fluid. No confluent airspace disease. No pneumothorax. Remote left rib fractures. IMPRESSION: Low lung volumes with mild dependent atelectasis. Electronically Signed   By: Rubye Oaks M.D.   On: 04/20/2017 23:28        Scheduled Meds: . aspirin EC  325 mg Oral Q breakfast  . Chlorhexidine Gluconate Cloth  6 each Topical Q0600  . docusate sodium  100 mg Oral BID  . donepezil  10 mg Oral QHS  . feeding supplement (ENSURE ENLIVE)  237 mL Oral BID BM  . metoprolol tartrate  12.5 mg Oral BID  . milk and molasses  1 enema Rectal Once  . multivitamin with minerals  1 tablet Oral Daily  . mupirocin ointment  1 application Nasal BID  . polyethylene glycol  17 g Oral BID   Continuous Infusions: . sodium chloride 75 mL/hr at 04/22/17 0650  . potassium chloride       LOS: 5 days    Time spent:    Pranavi Aure, MD [facility] Pager [phone]  If 7PM-7AM, please contact night-coverage [email or web address] Password [facility] 04/22/2017,  11:23 AM

## 2017-04-22 NOTE — Progress Notes (Signed)
SLP Cancellation Note  Patient Details Name: Sudie Baileyhyllis T Hargreaves MRN: [id number] DOB: May 10, 1936   Cancelled treatment:       Reason Eval/Treat Not Completed: Fatigue/lethargy limiting ability to participate; Family at bedside and requested return visit later in day as Pt typically sleeps late and is unrousable at this time. Family reports good tolerance of diet thus far if foods are soft and chopped. SLP will check back later as schedule permits, but will go ahead and downgrade to chopped.  Thank you,  Havery MorosDabney Porter, CCC-SLP [phone]    PORTER,DABNEY 04/22/2017, 10:24 AM

## 2017-04-23 DIAGNOSIS — S72142D Displaced intertrochanteric fracture of left femur, subsequent encounter for closed fracture with routine healing: Secondary | ICD-10-CM

## 2017-04-23 DIAGNOSIS — K5900 Constipation, unspecified: Secondary | ICD-10-CM

## 2017-04-23 DIAGNOSIS — N183 Chronic kidney disease, stage 3 (moderate): Secondary | ICD-10-CM

## 2017-04-23 DIAGNOSIS — F0390 Unspecified dementia without behavioral disturbance: Secondary | ICD-10-CM

## 2017-04-23 DIAGNOSIS — Z23 Encounter for immunization: Secondary | ICD-10-CM | POA: Diagnosis not present

## 2017-04-23 LAB — CBC
HEMATOCRIT: 28.6 % — AB (ref 36.0–46.0)
HEMOGLOBIN: 9.7 g/dL — AB (ref 12.0–15.0)
MCH: 31.2 pg (ref 26.0–34.0)
MCHC: 33.9 g/dL (ref 30.0–36.0)
MCV: 92 fL (ref 78.0–100.0)
Platelets: 335 10*3/uL (ref 150–400)
RBC: 3.11 MIL/uL — AB (ref 3.87–5.11)
RDW: 13.5 % (ref 11.5–15.5)
WBC: 11.7 10*3/uL — AB (ref 4.0–10.5)

## 2017-04-23 LAB — BASIC METABOLIC PANEL
ANION GAP: 7 (ref 5–15)
BUN: 9 mg/dL (ref 6–20)
CALCIUM: 7.8 mg/dL — AB (ref 8.9–10.3)
CO2: 25 mmol/L (ref 22–32)
Chloride: 107 mmol/L (ref 101–111)
Creatinine, Ser: 0.63 mg/dL (ref 0.44–1.00)
GFR calc non Af Amer: >60 mL/min (ref >60)
Glucose, Bld: 107 mg/dL — ABNORMAL HIGH (ref 65–99)
POTASSIUM: 3.4 mmol/L — AB (ref 3.5–5.1)
Sodium: 139 mmol/L (ref 135–145)

## 2017-04-23 MED ORDER — POTASSIUM CHLORIDE CRYS ER 20 MEQ PO TBCR
40.0000 meq | EXTENDED_RELEASE_TABLET | Freq: Once | ORAL | Status: AC
  Administered 2017-04-23: 40 meq via ORAL
  Filled 2017-04-23: qty 2

## 2017-04-23 MED ORDER — BISACODYL 10 MG RE SUPP: 10.0000 mg | Freq: Every day | RECTAL | 0 refills | Status: DC | PRN

## 2017-04-23 MED ORDER — DOCUSATE SODIUM 100 MG PO CAPS: 100.0000 mg | ORAL_CAPSULE | Freq: Two times a day (BID) | ORAL | 0 refills | Status: AC | PRN

## 2017-04-23 MED ORDER — POLYETHYLENE GLYCOL 3350 17 G PO PACK: 17.0000 g | PACK | Freq: Every day | ORAL | 0 refills | Status: DC | PRN

## 2017-04-23 MED ORDER — ASPIRIN 325 MG PO TBEC: 325.0000 mg | DELAYED_RELEASE_TABLET | Freq: Every day | ORAL | 0 refills | Status: AC

## 2017-04-23 MED ORDER — ENSURE ENLIVE PO LIQD: 237.0000 mL | Freq: Two times a day (BID) | ORAL | 12 refills | Status: AC

## 2017-04-23 MED ORDER — HYDROCODONE-ACETAMINOPHEN 5-325 MG PO TABS: 1.0000 | ORAL_TABLET | Freq: Four times a day (QID) | ORAL | 0 refills | Status: DC | PRN

## 2017-04-23 NOTE — Evaluation (Signed)
Clinical/Bedside Swallow Evaluation Patient Details  Name: Heather Cline MRN: [id number] Date of Birth: April 05, 1936  Today's Date: 04/23/2017 Time: SLP Start Time (ACUTE ONLY): 0945 SLP Stop Time (ACUTE ONLY): 1010 SLP Time Calculation (min) (ACUTE ONLY): 25 min  Past Medical History:  Past Medical History:  Diagnosis Date  . Dementia    Past Surgical History:  Past Surgical History:  Procedure Laterality Date  . INTRAMEDULLARY (IM) NAIL INTERTROCHANTERIC Left 04/18/2017   Procedure: INTRAMEDULLARY (IM) NAIL INTERTROCHANTRIC;  Surgeon: [name], MD;  Location: [facility];  Service: Orthopedics;  Laterality: Left;   HPI:  Pt is a 81 yo with recent fall which precipitated hospital stay; Hx per husband and pt's sister at time of admission.  She has had progressive severe dementia > 5 yrs now.  Lives with husband, requires assistance with all ADL's, will not eat much unless attended to via caregivers.  Is fully ambulatory around the house, but does have a tendency to wander; BSE attempted various times prior to this attempt, but pt either unavailable and/or lethargic and unable to participate.   Assessment / Plan / Recommendation Clinical Impression   Pt with probable cognitive-based oropharyngeal dysphagia characterized by oral holding, prolonged oral preparation/propulsion and required multi-modal cueing to elicit suspected delayed swallow response; no overt s/s of aspiration noted during BSE, but pt cognitively unable to elicit cough and/or swallow response volitionally.  Family member (sister-in-law) stated she has difficulty at home with meals and will oftentimes "hold her food" and/or pocket or masticate for a lengthy amount of time during meals.  She is also exhibiting a decline in appetite as well.  Cognitive strategies to increase po intake given to family member included increasing sensory stimulation (ie: offering cold consistencies paired with chopped/minced foods, multi-modal  cues to swallow and encouraging her to elicit swallow and manipulate food within her oral cavity during meals) to assist with increasing appetite and awareness of intake; swallowing precautions also discussed including slow rate, small bites/sips and decreasing environmental distractions during meals.  Recommend continuing Dysphagia 2 (minced) diet with thin liquids; ST will s/o and recommend HH SLP prn if pt continues to decline with swallowing ability. SLP Visit Diagnosis: Dysphagia, oropharyngeal phase (R13.12)    Aspiration Risk  Mild aspiration risk    Diet Recommendation   Dysphagia 2 (minced)/thin liquids  Medication Administration: Whole meds with puree (as tolerated)    Other  Recommendations Oral Care Recommendations: Oral care BID   Follow up Recommendations 24 hour supervision/assistance;Other (comment) (HH SLP if continues to decline for swallowing needs)      Frequency and Duration   n/a         Prognosis Prognosis for Safe Diet Advancement: Good Barriers to Reach Goals: Cognitive deficits Barriers/Prognosis Comment:  (discussed strategies with family)      Swallow Study   General Date of Onset: 04/17/17 HPI: Hx per husband and pt's sister.  She has had progressive severe dementia > 5 yrs now.  Lives with husband, requires assistance with all ADL's, will not eat much unless attended to.  Is fully ambulatory around the house, but does have a tendency to wander Type of Study: Bedside Swallow Evaluation Diet Prior to this Study: Dysphagia 2 (chopped);Thin liquids Temperature Spikes Noted: No Respiratory Status: Room air History of Recent Intubation: No Behavior/Cognition: Alert;Cooperative;Confused Oral Cavity Assessment: Within Functional Limits Oral Care Completed by SLP: No Oral Cavity - Dentition: Adequate natural dentition Vision: Functional for self-feeding Self-Feeding Abilities: Needs assist;Needs set up  Patient Positioning: Upright in bed Baseline Vocal  Quality: Low vocal intensity Volitional Cough: Cognitively unable to elicit Volitional Swallow: Unable to elicit    Oral/Motor/Sensory Function Overall Oral Motor/Sensory Function: Other (comment) (DTA d/t cognitive status; appears WFL )   Ice Chips Ice chips: Not tested   Thin Liquid Thin Liquid: Within functional limits Presentation: Cup;Straw Other Comments:  (minimal verbal cueing required to initiate straw sips)    Nectar Thick Nectar Thick Liquid: Not tested   Honey Thick Honey Thick Liquid: Not tested   Puree Puree: Impaired Presentation: Spoon Oral Phase Functional Implications: Oral holding;Other (comment) (required minimal verbal cueing)   Solid      Solid: Impaired Presentation: Spoon Oral Phase Impairments: Impaired mastication Oral Phase Functional Implications: Prolonged oral transit;Impaired mastication;Oral holding Pharyngeal Phase Impairments: Suspected delayed Swallow    Functional Assessment Tool Used: NOMS; clinical judgment Functional Limitations: Swallowing Swallow Current Status ([id number]([id number]): At least 20 percent but less than 40 percent impaired, limited or restricted Swallow Goal Status [phone]([id number]): At least 20 percent but less than 40 percent impaired, limited or restricted Swallow Discharge Status [phone]([id number]): At least 20 percent but less than 40 percent impaired, limited or restricted   Heather Cline, M.S., CCC-SLP 04/23/2017,10:23 AM

## 2017-04-23 NOTE — Progress Notes (Signed)
Late Entry  Patient discharge instructions discussed with her care giver and husband.  They both verbalized understaning.  Patient left the floor via EMS stretcher with in stable condition.

## 2017-04-23 NOTE — Care Management Note (Signed)
Case Management Note  Patient Details  Name: Heather Cline MRN: [id number] Date of Birth: Mar 26, 1936  Subjective/Objective:        Heather Cline          Pt from home, has advanced dementia and lives with her husband. They have 24/7 caregivers. Family does not want to pursue SNF placement at this time and wants to take pt home. Pt will need HH nursing/PT and hospital bed.Family requests [facility] be HH and DME agency. Aware HH has 48hrs to make first visit.   Action/Plan: Alroy BailiffLinda Lothian, [facility], aware of referrals and has obtained pt info from chart. [facility] Hospital bed has been delivered prior to DC. [facility] aware of DC today. Per RN family wishes to transport pt home via car.   Expected Discharge Date:  04/23/17               Expected Discharge Plan:  Home w Home Health Services  In-House Referral:  Clinical Social Work  Discharge planning Services  CM Consult  Post Acute Care Choice:  Home Health Choice offered to:  Patient  DME Arranged:  Hospital bed DME Agency:  [facility]  HH [facility]  [facility]  Status of Service:  Completed, signed off  Malcolm MetroChildress, Contessa Preuss Demske, RN 04/23/2017, 11:25 AM

## 2017-04-23 NOTE — Care Management Important Message (Signed)
Important Message  Patient Details  Name: Heather Baileyhyllis T Granja MRN: [id number] Date of Birth: 1936/02/28   Medicare Important Message Given:  Yes    Malcolm MetroChildress, Idalys Konecny Demske, RN 04/23/2017, 11:28 AM

## 2017-04-23 NOTE — Discharge Summary (Signed)
Physician Discharge Summary  Heather Cline WUJ:[id number] DOB: [birth year] DOA: 04/17/2017  PCP: [name], MD  Admit date: 04/17/2017 Discharge date: 04/23/2017  Admitted From: Home Disposition: Home  Recommendations for Outpatient Follow-up:  1. Follow up with PCP in 1 weeks 2. Follow up with orthopedic Dr. Landry Mellow in 10 days 3. Bowel regimen/laxative as needed for constipation 4. Aspirin 325 mg orally daily for DVT prophylaxis. Stop when further advised by orthopedic 5. Encourage oral diet. Use supplements such as Ensure/boost as needed 6. Dressings change/staple removal per orthopedic 7. Please follow up on the following pending results:  Home Health: Home physical therapy Equipment/Devices: None  Discharge Condition: Stable CODE STATUS: DO NOT RESUSCITATE Diet recommendation: Regular  Brief/Interim Summary: 81 year old female with history of severe dementia came to the ER with complaints of fall and left hip pain. She was found to have left hip fracture therefore orthopedic was consulted. She ended up going ORIF on 04/18/2017 which she tolerated well. Postop she was noted to have constipation with fecal impaction therefore she was started on bowel regimen. Also due to couple episodes of diarrhea C. difficile was checked which was positive for antigen but negative for toxin therefore was determined not to treat any further. Postoperatively was difficult for her to work with physical therapy given advanced dementia but eventually was determined that she can get physical therapy at home and that's what family wanted as well. There was also a brief episode where she went into tachycardia which was likely premature atrial beats versus atrial fibrillation and low dose beta blocker was started. It was deemed she's not a good candidate for anticoagulation given risk and benefit therefore aspirin full dose was continued for DVT prophylaxis postop at this time. She was given gentle IV  fluid hydration. Hemoglobin remained stable and no transfusion was necessary. At this time family really wants to take her home for further care as that's where patient is more comfortable in the known environment which is reasonable. She is deemed medically stable and has reached maximum benefit from in hospital stay. She can follow-up outpatient with primary care physician and orthopedic as mentioned above. Patient is doing as best as she could be and does not have any complaints. No complaints from family as well. Family does not want to pursue skilled nursing facility therefore with the help of case management and will make arrangements for home health nursing and physical therapy along with hospital bed.  Discharge Diagnoses:  Principal Problem:   Closed displaced intertrochanteric fracture of left femur (HCC) Active Problems:   Fall at home, initial encounter   Severe dementia   Pyuria   CKD (chronic kidney disease) stage 3, GFR 30-59 ml/min   Fecal impaction (HCC)   Hip fracture (HCC)   Left hip fracture status post ORIF -Family does not want skilled nursing facility therefore will send the patient home and make arrangements for home health nursing and physical therapy -Pain control. Caution with constipation. Bowel regimen given -Aspirin prescribed for DVT prophylaxis -Follow-up with outpatient orthopedics in next 10 days for dressing/staple removal and further care  Paroxysmal atrial fibrillation/premature atrial complex tachycardia, improved -Start her on low-dose beta blocker yesterday. Tolerating well, discontinue outpatient if her blood pressure remains well -No need for anticoagulation given risks and benefits  History of severe dementia -Continue Aricept  Constipation with fecal impaction, improved -Bowel regimen as needed. Avoid diarrhea  Diarrhea, resolved -C. difficile negative for toxin but positive antigen. No treatment at this  time  Anemia of chronic  disease -Continue to monitor hemoglobin  Chronic kidney disease stage III -Avoid nephrotoxic drugs. Creatinine stable at baseline.  Overall her health issues are complicated and difficult to treat in the setting of severe dementia as she is unable to communicate well or participate in her recovery  Discharge Instructions   Allergies as of 04/23/2017   No Known Allergies     Medication List    TAKE these medications   aspirin 325 MG EC tablet Take 1 tablet (325 mg total) by mouth daily with breakfast.   bisacodyl 10 MG suppository Commonly known as:  DULCOLAX Place 1 suppository (10 mg total) rectally daily as needed for moderate constipation.   docusate sodium 100 MG capsule Commonly known as:  COLACE Take 1 capsule (100 mg total) by mouth 2 (two) times daily as needed for mild constipation.   donepezil 10 MG tablet Commonly known as:  ARICEPT Take 10 mg by mouth at bedtime.   feeding supplement (ENSURE ENLIVE) Liqd Take 237 mLs by mouth 2 (two) times daily between meals.   HYDROcodone-acetaminophen 5-325 MG tablet Commonly known as:  NORCO/VICODIN Take 1-2 tablets by mouth every 6 (six) hours as needed for moderate pain.   polyethylene glycol packet Commonly known as:  MIRALAX / GLYCOLAX Take 17 g by mouth daily as needed for severe constipation.            Durable Medical Equipment        Start     Ordered   04/21/17 1451  For home use only DME Hospital bed  Once    Question Answer Comment  Patient has (list medical condition): hip fx   The above medical condition requires: Patient requires the ability to reposition frequently   Head must be elevated greater than: 30 degrees   Bed type Semi-electric   Trapeze Bar Yes      04/21/17 1451     Follow-up Information    [name], MD. Schedule an appointment as soon as possible for a visit in 1 week(s).   Specialty:  Internal Medicine Contact information: [address] [place]  [address] [phone]        [name], MD. Schedule an appointment as soon as possible for a visit in 10 day(s).   Specialties:  Orthopedic Surgery, Radiology Contact information: [address] [place] [address] [phone]          No Known Allergies  Consultations:  Orthopedic   Procedures/Studies: Dg Chest 1 View  Result Date: 04/17/2017 CLINICAL DATA:  Patient fell onto left side. History of Alzheimer's. Fracture of the hip. Preop. EXAM: CHEST 1 VIEW COMPARISON:  None. FINDINGS: Cardiomegaly with aortic atherosclerosis. Lungs are clear without effusion or pneumothorax. No pulmonary consolidation or contusion. Old left-sided fifth through seventh rib fractures. No acute osseous abnormality. IMPRESSION: 1. Cardiomegaly with aortic atherosclerosis. 2. No acute pulmonary disease. Electronically Signed   By: Tollie Eth M.D.   On: 04/17/2017 21:32   Ct Abdomen Pelvis W Contrast  Result Date: 04/20/2017 CLINICAL DATA:  81 year old female a above presenting with diarrhea and lower abdominal pain. History of prior left hip fracture. EXAM: CT ABDOMEN AND PELVIS WITH CONTRAST TECHNIQUE: Multidetector CT imaging of the abdomen and pelvis was performed using the standard protocol following bolus administration of intravenous contrast. CONTRAST:  <See Chart> ISOVUE-300 IOPAMIDOL (ISOVUE-300) INJECTION 61%, ISOVUE-300 IOPAMIDOL (ISOVUE-300) INJECTION 61% COMPARISON:  Pelvic radiograph dated 04/17/2017 FINDINGS: Lower chest:  There are bibasilar atelectatic changes. The visualized lung bases are otherwise clear. No intra-abdominal free air or free fluid. Hepatobiliary: Multifocal small hypodense liver lesions measuring up to 1.6 cm in the left lobe of the liver, incompletely characterized, likely cysts and less likely hemangioma. There is no intrahepatic biliary ductal dilatation. No calcified gallstone or pericholecystic fluid. Pancreas: Unremarkable. No pancreatic  ductal dilatation or surrounding inflammatory changes. Spleen: Normal in size without focal abnormality. Adrenals/Urinary Tract: The adrenal glands are unremarkable. There is a 2.5 cm left renal upper pole cyst as well as small left renal parapelvic cysts. A 4 mm left renal interpolar stone. There is no hydronephrosis on either side. 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 The urinary bladder is unremarkable. Stomach/Bowel: There is extensive sigmoid diverticulosis without active inflammatory changes. Large stool ball noted at the rectal vault which may represent a degree of impaction. There is no evidence of bowel obstruction or active inflammation. Normal appendix. Vascular/Lymphatic: There is aortoiliac atherosclerotic disease. The IVC is unremarkable. The splenic vein, SMV, and main portal vein are patent. There is no portal venous gas. New no adenopathy. Reproductive: The uterus and ovaries are grossly unremarkable. No pelvic mass. Other: None Musculoskeletal: There is degenerative changes of the spine. Comminuted left femoral neck fracture status post ORIF. Postsurgical changes of the left hip with small amount of fluid and small pockets of air in the soft tissues of the left hip as well as cutaneous staples. No fluid collection or abscess. IMPRESSION: 1. Large stool within the rectum concerning for fecal impaction. Clinical correlation is recommended. No bowel obstruction. Normal appendix. 2. Extensive sigmoid diverticulosis without active inflammation. 3. A 4 mm nonobstructing left renal calculus.  No hydronephrosis. 4. Comminuted fracture of the left femoral neck status post ORIF. 5.  Aortic Atherosclerosis (ICD10-I70.0). Electronically Signed   By: Elgie Collard M.D.   On: 04/20/2017 23:52   Dg Chest Port 1 View  Result Date: 04/20/2017 CLINICAL DATA:  Fever. EXAM: PORTABLE CHEST 1 VIEW COMPARISON:  Chest radiograph 04/17/2017 FINDINGS: Lower lung volumes from  prior exam with mild dependent atelectasis. Crowding of bronchovascular structures. There is a stable heart size and mediastinal contours with atherosclerosis of the thoracic aorta. Minimal blunting of left costophrenic angle is likely secondary to epicardial fat pad as seen on concurrent CT, no definite pleural fluid. No confluent airspace disease. No pneumothorax. Remote left rib fractures. IMPRESSION: Low lung volumes with mild dependent atelectasis. Electronically Signed   By: Rubye Oaks M.D.   On: 04/20/2017 23:28   Dg Abd Portable 1v  Result Date: 04/22/2017 CLINICAL DATA:  Acute onset of constipation. Recent left hip replacement. Initial encounter. EXAM: PORTABLE ABDOMEN - 1 VIEW COMPARISON:  CT of the abdomen and pelvis from 04/20/2017 FINDINGS: There appears to be mild wall thickening along the descending colon. Would correlate clinically for any evidence of infectious or inflammatory colitis. The remaining bowel is largely filled with air and grossly unremarkable. There is no evidence of bowel obstruction. No free intra-abdominal air is identified, though evaluation for free air is limited on a single supine view. The visualized osseous structures are within normal limits; the sacroiliac joints are unremarkable in appearance. IMPRESSION: Apparent mild wall thickening along the descending colon. Would correlate clinically for any evidence of infectious or inflammatory colitis. No stool seen in the colon to suggest constipation. No free intra-abdominal air seen. Electronically Signed   By: Roanna Raider M.D.   On: 04/22/2017 18:24  Dg Hip Operative Unilat With Pelvis Left  Result Date: 04/18/2017 CLINICAL DATA:  Open reduction and internal fixation of left hip fracture. EXAM: OPERATIVE left HIP (WITH PELVIS IF PERFORMED) 8 VIEWS TECHNIQUE: Fluoroscopic spot image(s) were submitted for interpretation post-operatively. FLUOROSCOPY TIME:  1 minutes 30 seconds. COMPARISON:  Radiographs of April 17, 2017. FINDINGS: Eight intraoperative fluoroscopic images of the left hip demonstrate internal fixation of comminuted intertrochanteric fracture of the proximal left femur. IMPRESSION: Status post reduction and internal fixation of proximal left femoral intertrochanteric fracture. Electronically Signed   By: Lupita RaiderJames  Green Jr, M.D.   On: 04/18/2017 14:21   Dg Hip Unilat With Pelvis 2-3 Views Left  Result Date: 04/17/2017 CLINICAL DATA:  Patient fell onto left side.  Left hip fracture. EXAM: DG HIP (WITH OR WITHOUT PELVIS) 2-3V LEFT COMPARISON:  None. FINDINGS: An acute, closed, varus angulated intertrochanteric fracture of the left femur is noted with avulsed lesser trochanter. The femoral head is seated within the left hip joint with joint space narrowing noted. The bony pelvis appears grossly intact. A large stool ball projects over the mid pelvis obscuring the lower sacrum and coccyx. IMPRESSION: 1. Acute, closed, varus angulated intertrochanteric fracture of the left femur with avulsed lesser trochanter. No hip joint dislocation. 2. Intact appearing bony pelvis given limitations of what appears to be fecal impaction in the mid pelvis. Electronically Signed   By: Tollie Ethavid  Kwon M.D.   On: 04/17/2017 21:35       Subjective:   Discharge Exam: Vitals:   04/22/17 2125 04/23/17 0550  BP: (!) 104/38 99/62  Pulse: 61 (!) 51  Resp: 18 16  Temp: 97.7 F (36.5 C) 98 F (36.7 C)   Vitals:   04/21/17 2100 04/22/17 0648 04/22/17 2125 04/23/17 0550  BP: (!) 112/48 (!) 105/55 (!) 104/38 99/62  Pulse: 83 (!) 111 61 (!) 51  Resp: 18 18 18 16   Temp: 97.7 F (36.5 C) 97.7 F (36.5 C) 97.7 F (36.5 C) 98 F (36.7 C)  TempSrc: Axillary Axillary Oral Oral  SpO2: 97% 99% 94% 96%  Weight:      Height:        General: Patient is alert and awake but pleasantly confused and remains nonverbal. Per family this is a baseline Cardiovascular: RRR no atrial fibrillation or irregular beats noted, S1/S2 +, no  rubs, no gallops Respiratory: CTA bilaterally, no wheezing, no rhonchi Abdominal: Soft, NT, ND, bowel sounds + Extremities: no edema, no cyanosis. Left hip dressing in place without any signs of infection or bleeding    The results of significant diagnostics from this hospitalization (including imaging, microbiology, ancillary and laboratory) are listed below for reference.     Microbiology: Recent Results (from the past 240 hour(s))  Urine culture     Status: None   Collection Time: 04/17/17  9:09 PM  Result Value Ref Range Status   Specimen Description URINE, RANDOM  Final   Special Requests NONE  Final   Culture   Final    NO GROWTH Performed at [facility], [address] [place], [place] [address]    Report Status 04/19/2017 FINAL  Final  Surgical PCR screen     Status: Abnormal   Collection Time: 04/17/17 11:54 PM  Result Value Ref Range Status   MRSA, PCR POSITIVE (A) NEGATIVE Final    Comment: RESULT CALLED TO, READ BACK BY AND VERIFIED WITH: DANIEL,C AT 0535 BY HUFFINES,S ON 04/18/17.    Staphylococcus aureus POSITIVE (  A) NEGATIVE Final    Comment:        The Xpert SA Assay (FDA approved for NASAL specimens in patients over 34 years of age), is one component of a comprehensive surveillance program.  Test performance has been validated by [facility] for patients greater than or equal to 26 year old. It is not intended to diagnose infection nor to guide or monitor treatment. RESULT CALLED TO, READ BACK BY AND VERIFIED WITH: DANIEL,C AT 0535 BY HUFFINES,S ON 04/18/17.   C difficile quick scan w PCR reflex     Status: Abnormal   Collection Time: 04/20/17  9:15 AM  Result Value Ref Range Status   C Diff antigen POSITIVE (A) NEGATIVE Final   C Diff toxin NEGATIVE NEGATIVE Final   C Diff interpretation Results are indeterminate. See PCR results.  Final  Gastrointestinal Panel by PCR , Stool     Status: None   Collection Time: 04/20/17  9:15 AM  Result  Value Ref Range Status   Campylobacter species NOT DETECTED NOT DETECTED Final   Plesimonas shigelloides NOT DETECTED NOT DETECTED Final   Salmonella species NOT DETECTED NOT DETECTED Final   Yersinia enterocolitica NOT DETECTED NOT DETECTED Final   Vibrio species NOT DETECTED NOT DETECTED Final   Vibrio cholerae NOT DETECTED NOT DETECTED Final   Enteroaggregative E coli (EAEC) NOT DETECTED NOT DETECTED Final   Enteropathogenic E coli (EPEC) NOT DETECTED NOT DETECTED Final   Enterotoxigenic E coli (ETEC) NOT DETECTED NOT DETECTED Final   Shiga like toxin producing E coli (STEC) NOT DETECTED NOT DETECTED Final   Shigella/Enteroinvasive E coli (EIEC) NOT DETECTED NOT DETECTED Final   Cryptosporidium NOT DETECTED NOT DETECTED Final   Cyclospora cayetanensis NOT DETECTED NOT DETECTED Final   Entamoeba histolytica NOT DETECTED NOT DETECTED Final   Giardia lamblia NOT DETECTED NOT DETECTED Final   Adenovirus F40/41 NOT DETECTED NOT DETECTED Final   Astrovirus NOT DETECTED NOT DETECTED Final   Norovirus GI/GII NOT DETECTED NOT DETECTED Final   Rotavirus A NOT DETECTED NOT DETECTED Final   Sapovirus (I, II, IV, and V) NOT DETECTED NOT DETECTED Final     Labs: BNP (last 3 results) No results for input(s): BNP in the last 8760 hours. Basic Metabolic Panel:  Recent Labs Lab 04/18/17 0500 04/19/17 0616 04/20/17 0600 04/22/17 0428 04/22/17 1200 04/23/17 0603  NA 139 133* 138 136  --  139  K 4.0 3.5 3.5 2.9*  --  3.4*  CL 106 102 105 106  --  107  CO2 24 23 24 25   --  25  GLUCOSE 126* 126* 136* 129*  --  107*  BUN 20 16 13 11   --  9  CREATININE 0.96 0.91 0.85 0.63  --  0.63  CALCIUM 8.9 8.1* 8.3* 7.5*  --  7.8*  MG  --   --   --   --  1.8  --    Liver Function Tests:  Recent Labs Lab 04/22/17 0428  AST 28  ALT 18  ALKPHOS 47  BILITOT 0.7  PROT 4.6*  ALBUMIN 1.9*   No results for input(s): LIPASE, AMYLASE in the last 168 hours. No results for input(s): AMMONIA in the last  168 hours. CBC:  Recent Labs Lab 04/17/17 2037  04/19/17 0616 04/20/17 0600 04/21/17 0451 04/22/17 0428 04/23/17 0603  WBC 11.4*  < > 17.7* 22.4* 16.2* 12.9* 11.7*  NEUTROABS 8.8*  --   --   --   --  10.5*  --   HGB 14.7  < > 11.7* 11.9* 9.6* 8.5* 9.7*  HCT 44.7  < > 35.1* 35.3* 29.5* 25.5* 28.6*  MCV 93.3  < > 92.1 91.7 93.7 94.1 92.0  PLT 294  < > 232 228 244 271 335  < > = values in this interval not displayed. Cardiac Enzymes: No results for input(s): CKTOTAL, CKMB, CKMBINDEX, TROPONINI in the last 168 hours. BNP: Invalid input(s): POCBNP CBG:  Recent Labs Lab 04/19/17 2058  GLUCAP 107*   D-Dimer No results for input(s): DDIMER in the last 72 hours. Hgb A1c No results for input(s): HGBA1C in the last 72 hours. Lipid Profile No results for input(s): CHOL, HDL, LDLCALC, TRIG, CHOLHDL, LDLDIRECT in the last 72 hours. Thyroid function studies No results for input(s): TSH, T4TOTAL, T3FREE, THYROIDAB in the last 72 hours.  Invalid input(s): FREET3 Anemia work up  Recent Labs  04/22/17 0538 04/22/17 1227  VITAMINB12  --  390  FOLATE  --  12.5  FERRITIN  --  203  TIBC  --  168*  IRON  --  22*  RETICCTPCT 3.2*  --    Urinalysis    Component Value Date/Time   COLORURINE YELLOW 04/17/2017 2109   APPEARANCEUR CLOUDY (A) 04/17/2017 2109   LABSPEC 1.019 04/17/2017 2109   PHURINE 7.0 04/17/2017 2109   GLUCOSEU NEGATIVE 04/17/2017 2109   HGBUR NEGATIVE 04/17/2017 2109   BILIRUBINUR NEGATIVE 04/17/2017 2109   KETONESUR NEGATIVE 04/17/2017 2109   PROTEINUR NEGATIVE 04/17/2017 2109   UROBILINOGEN 0.2 08/23/2012 1036   NITRITE NEGATIVE 04/17/2017 2109   LEUKOCYTESUR MODERATE (A) 04/17/2017 2109   Sepsis Labs Invalid input(s): PROCALCITONIN,  WBC,  LACTICIDVEN Microbiology Recent Results (from the past 240 hour(s))  Urine culture     Status: None   Collection Time: 04/17/17  9:09 PM  Result Value Ref Range Status   Specimen Description URINE, RANDOM  Final    Special Requests NONE  Final   Culture   Final    NO GROWTH Performed at [facility], [address] [place], [place] [address]    Report Status 04/19/2017 FINAL  Final  Surgical PCR screen     Status: Abnormal   Collection Time: 04/17/17 11:54 PM  Result Value Ref Range Status   MRSA, PCR POSITIVE (A) NEGATIVE Final    Comment: RESULT CALLED TO, READ BACK BY AND VERIFIED WITH: DANIEL,C AT 0535 BY HUFFINES,S ON 04/18/17.    Staphylococcus aureus POSITIVE (A) NEGATIVE Final    Comment:        The Xpert SA Assay (FDA approved for NASAL specimens in patients over 72 years of age), is one component of a comprehensive surveillance program.  Test performance has been validated by [facility] for patients greater than or equal to 37 year old. It is not intended to diagnose infection nor to guide or monitor treatment. RESULT CALLED TO, READ BACK BY AND VERIFIED WITH: DANIEL,C AT 0535 BY HUFFINES,S ON 04/18/17.   C difficile quick scan w PCR reflex     Status: Abnormal   Collection Time: 04/20/17  9:15 AM  Result Value Ref Range Status   C Diff antigen POSITIVE (A) NEGATIVE Final   C Diff toxin NEGATIVE NEGATIVE Final   C Diff interpretation Results are indeterminate. See PCR results.  Final  Gastrointestinal Panel by PCR , Stool     Status: None   Collection Time: 04/20/17  9:15 AM  Result Value Ref Range Status  Campylobacter species NOT DETECTED NOT DETECTED Final   Plesimonas shigelloides NOT DETECTED NOT DETECTED Final   Salmonella species NOT DETECTED NOT DETECTED Final   Yersinia enterocolitica NOT DETECTED NOT DETECTED Final   Vibrio species NOT DETECTED NOT DETECTED Final   Vibrio cholerae NOT DETECTED NOT DETECTED Final   Enteroaggregative E coli (EAEC) NOT DETECTED NOT DETECTED Final   Enteropathogenic E coli (EPEC) NOT DETECTED NOT DETECTED Final   Enterotoxigenic E coli (ETEC) NOT DETECTED NOT DETECTED Final   Shiga like toxin producing E coli (STEC) NOT  DETECTED NOT DETECTED Final   Shigella/Enteroinvasive E coli (EIEC) NOT DETECTED NOT DETECTED Final   Cryptosporidium NOT DETECTED NOT DETECTED Final   Cyclospora cayetanensis NOT DETECTED NOT DETECTED Final   Entamoeba histolytica NOT DETECTED NOT DETECTED Final   Giardia lamblia NOT DETECTED NOT DETECTED Final   Adenovirus F40/41 NOT DETECTED NOT DETECTED Final   Astrovirus NOT DETECTED NOT DETECTED Final   Norovirus GI/GII NOT DETECTED NOT DETECTED Final   Rotavirus A NOT DETECTED NOT DETECTED Final   Sapovirus (I, II, IV, and V) NOT DETECTED NOT DETECTED Final     Time coordinating discharge: Over 30 minutes  SIGNED:   Dimple Nanas, MD  [facility] 04/23/2017, 11:16 AM Pager   If 7PM-7AM, please contact night-coverage [email or web address] Password [email or web address]

## 2017-04-24 DIAGNOSIS — I129 Hypertensive chronic kidney disease with stage 1 through stage 4 chronic kidney disease, or unspecified chronic kidney disease: Secondary | ICD-10-CM | POA: Diagnosis not present

## 2017-04-24 DIAGNOSIS — R000 Tachycardia, unspecified: Secondary | ICD-10-CM | POA: Diagnosis not present

## 2017-04-24 DIAGNOSIS — W0110XD Fall on same level from slipping, tripping and stumbling with subsequent striking against unspecified object, subsequent encounter: Secondary | ICD-10-CM | POA: Diagnosis not present

## 2017-04-24 DIAGNOSIS — D631 Anemia in chronic kidney disease: Secondary | ICD-10-CM | POA: Diagnosis not present

## 2017-04-24 DIAGNOSIS — S72142D Displaced intertrochanteric fracture of left femur, subsequent encounter for closed fracture with routine healing: Secondary | ICD-10-CM | POA: Diagnosis not present

## 2017-04-24 DIAGNOSIS — Z79899 Other long term (current) drug therapy: Secondary | ICD-10-CM | POA: Diagnosis not present

## 2017-04-24 DIAGNOSIS — N183 Chronic kidney disease, stage 3 (moderate): Secondary | ICD-10-CM | POA: Diagnosis not present

## 2017-04-25 DIAGNOSIS — S72142D Displaced intertrochanteric fracture of left femur, subsequent encounter for closed fracture with routine healing: Secondary | ICD-10-CM | POA: Diagnosis not present

## 2017-04-25 DIAGNOSIS — W0110XD Fall on same level from slipping, tripping and stumbling with subsequent striking against unspecified object, subsequent encounter: Secondary | ICD-10-CM | POA: Diagnosis not present

## 2017-04-25 DIAGNOSIS — N183 Chronic kidney disease, stage 3 (moderate): Secondary | ICD-10-CM | POA: Diagnosis not present

## 2017-04-25 DIAGNOSIS — D631 Anemia in chronic kidney disease: Secondary | ICD-10-CM | POA: Diagnosis not present

## 2017-04-25 DIAGNOSIS — I129 Hypertensive chronic kidney disease with stage 1 through stage 4 chronic kidney disease, or unspecified chronic kidney disease: Secondary | ICD-10-CM | POA: Diagnosis not present

## 2017-04-25 DIAGNOSIS — Z79899 Other long term (current) drug therapy: Secondary | ICD-10-CM | POA: Diagnosis not present

## 2017-04-25 DIAGNOSIS — R000 Tachycardia, unspecified: Secondary | ICD-10-CM | POA: Diagnosis not present

## 2017-04-25 LAB — CLOSTRIDIUM DIFFICILE BY PCR: CDIFFPCR: POSITIVE — AB

## 2017-04-28 DIAGNOSIS — R000 Tachycardia, unspecified: Secondary | ICD-10-CM | POA: Diagnosis not present

## 2017-04-28 DIAGNOSIS — I129 Hypertensive chronic kidney disease with stage 1 through stage 4 chronic kidney disease, or unspecified chronic kidney disease: Secondary | ICD-10-CM | POA: Diagnosis not present

## 2017-04-28 DIAGNOSIS — N183 Chronic kidney disease, stage 3 (moderate): Secondary | ICD-10-CM | POA: Diagnosis not present

## 2017-04-28 DIAGNOSIS — Z79899 Other long term (current) drug therapy: Secondary | ICD-10-CM | POA: Diagnosis not present

## 2017-04-28 DIAGNOSIS — S72142D Displaced intertrochanteric fracture of left femur, subsequent encounter for closed fracture with routine healing: Secondary | ICD-10-CM | POA: Diagnosis not present

## 2017-04-28 DIAGNOSIS — D631 Anemia in chronic kidney disease: Secondary | ICD-10-CM | POA: Diagnosis not present

## 2017-04-28 DIAGNOSIS — W0110XD Fall on same level from slipping, tripping and stumbling with subsequent striking against unspecified object, subsequent encounter: Secondary | ICD-10-CM | POA: Diagnosis not present

## 2017-04-29 DIAGNOSIS — I129 Hypertensive chronic kidney disease with stage 1 through stage 4 chronic kidney disease, or unspecified chronic kidney disease: Secondary | ICD-10-CM | POA: Diagnosis not present

## 2017-04-29 DIAGNOSIS — Z79899 Other long term (current) drug therapy: Secondary | ICD-10-CM | POA: Diagnosis not present

## 2017-04-29 DIAGNOSIS — S72142D Displaced intertrochanteric fracture of left femur, subsequent encounter for closed fracture with routine healing: Secondary | ICD-10-CM | POA: Diagnosis not present

## 2017-04-29 DIAGNOSIS — N183 Chronic kidney disease, stage 3 (moderate): Secondary | ICD-10-CM | POA: Diagnosis not present

## 2017-04-29 DIAGNOSIS — W0110XD Fall on same level from slipping, tripping and stumbling with subsequent striking against unspecified object, subsequent encounter: Secondary | ICD-10-CM | POA: Diagnosis not present

## 2017-04-29 DIAGNOSIS — D631 Anemia in chronic kidney disease: Secondary | ICD-10-CM | POA: Diagnosis not present

## 2017-04-29 DIAGNOSIS — R000 Tachycardia, unspecified: Secondary | ICD-10-CM | POA: Diagnosis not present

## 2017-04-30 DIAGNOSIS — D631 Anemia in chronic kidney disease: Secondary | ICD-10-CM | POA: Diagnosis not present

## 2017-04-30 DIAGNOSIS — S72142D Displaced intertrochanteric fracture of left femur, subsequent encounter for closed fracture with routine healing: Secondary | ICD-10-CM | POA: Diagnosis not present

## 2017-04-30 DIAGNOSIS — W0110XD Fall on same level from slipping, tripping and stumbling with subsequent striking against unspecified object, subsequent encounter: Secondary | ICD-10-CM | POA: Diagnosis not present

## 2017-04-30 DIAGNOSIS — S72012D Unspecified intracapsular fracture of left femur, subsequent encounter for closed fracture with routine healing: Secondary | ICD-10-CM | POA: Diagnosis not present

## 2017-04-30 DIAGNOSIS — R000 Tachycardia, unspecified: Secondary | ICD-10-CM | POA: Diagnosis not present

## 2017-04-30 DIAGNOSIS — N183 Chronic kidney disease, stage 3 (moderate): Secondary | ICD-10-CM | POA: Diagnosis not present

## 2017-04-30 DIAGNOSIS — Z79899 Other long term (current) drug therapy: Secondary | ICD-10-CM | POA: Diagnosis not present

## 2017-04-30 DIAGNOSIS — I129 Hypertensive chronic kidney disease with stage 1 through stage 4 chronic kidney disease, or unspecified chronic kidney disease: Secondary | ICD-10-CM | POA: Diagnosis not present

## 2017-04-30 DIAGNOSIS — D649 Anemia, unspecified: Secondary | ICD-10-CM | POA: Diagnosis not present

## 2017-04-30 DIAGNOSIS — K5900 Constipation, unspecified: Secondary | ICD-10-CM | POA: Diagnosis not present

## 2017-04-30 DIAGNOSIS — Z09 Encounter for follow-up examination after completed treatment for conditions other than malignant neoplasm: Secondary | ICD-10-CM | POA: Diagnosis not present

## 2017-05-01 DIAGNOSIS — Z79899 Other long term (current) drug therapy: Secondary | ICD-10-CM | POA: Diagnosis not present

## 2017-05-01 DIAGNOSIS — I129 Hypertensive chronic kidney disease with stage 1 through stage 4 chronic kidney disease, or unspecified chronic kidney disease: Secondary | ICD-10-CM | POA: Diagnosis not present

## 2017-05-01 DIAGNOSIS — R000 Tachycardia, unspecified: Secondary | ICD-10-CM | POA: Diagnosis not present

## 2017-05-01 DIAGNOSIS — S72142D Displaced intertrochanteric fracture of left femur, subsequent encounter for closed fracture with routine healing: Secondary | ICD-10-CM | POA: Diagnosis not present

## 2017-05-01 DIAGNOSIS — W0110XD Fall on same level from slipping, tripping and stumbling with subsequent striking against unspecified object, subsequent encounter: Secondary | ICD-10-CM | POA: Diagnosis not present

## 2017-05-01 DIAGNOSIS — N183 Chronic kidney disease, stage 3 (moderate): Secondary | ICD-10-CM | POA: Diagnosis not present

## 2017-05-01 DIAGNOSIS — D631 Anemia in chronic kidney disease: Secondary | ICD-10-CM | POA: Diagnosis not present

## 2017-05-02 ENCOUNTER — Ambulatory Visit: Payer: Medicare Other

## 2017-05-02 ENCOUNTER — Ambulatory Visit (INDEPENDENT_AMBULATORY_CARE_PROVIDER_SITE_OTHER): Payer: Medicare Other

## 2017-05-02 ENCOUNTER — Ambulatory Visit (INDEPENDENT_AMBULATORY_CARE_PROVIDER_SITE_OTHER): Payer: Self-pay | Admitting: Orthopedic Surgery

## 2017-05-02 DIAGNOSIS — Z79899 Other long term (current) drug therapy: Secondary | ICD-10-CM | POA: Diagnosis not present

## 2017-05-02 DIAGNOSIS — I129 Hypertensive chronic kidney disease with stage 1 through stage 4 chronic kidney disease, or unspecified chronic kidney disease: Secondary | ICD-10-CM | POA: Diagnosis not present

## 2017-05-02 DIAGNOSIS — S72142D Displaced intertrochanteric fracture of left femur, subsequent encounter for closed fracture with routine healing: Secondary | ICD-10-CM

## 2017-05-02 DIAGNOSIS — T814XXA Infection following a procedure, initial encounter: Secondary | ICD-10-CM

## 2017-05-02 DIAGNOSIS — W0110XD Fall on same level from slipping, tripping and stumbling with subsequent striking against unspecified object, subsequent encounter: Secondary | ICD-10-CM | POA: Diagnosis not present

## 2017-05-02 DIAGNOSIS — IMO0001 Reserved for inherently not codable concepts without codable children: Secondary | ICD-10-CM

## 2017-05-02 DIAGNOSIS — R000 Tachycardia, unspecified: Secondary | ICD-10-CM | POA: Diagnosis not present

## 2017-05-02 DIAGNOSIS — D631 Anemia in chronic kidney disease: Secondary | ICD-10-CM | POA: Diagnosis not present

## 2017-05-02 DIAGNOSIS — N183 Chronic kidney disease, stage 3 (moderate): Secondary | ICD-10-CM | POA: Diagnosis not present

## 2017-05-02 MED ORDER — CEPHALEXIN 500 MG PO CAPS: 500.0000 mg | ORAL_CAPSULE | Freq: Two times a day (BID) | ORAL | 0 refills | Status: DC

## 2017-05-02 MED ORDER — HYDROCODONE-ACETAMINOPHEN 5-325 MG PO TABS: 1.0000 | ORAL_TABLET | Freq: Four times a day (QID) | ORAL | 0 refills | Status: DC | PRN

## 2017-05-02 NOTE — Patient Instructions (Addendum)
Meds ordered this encounter  Medications  . HYDROcodone-acetaminophen (NORCO/VICODIN) 5-325 MG tablet    Sig: Take 1 tablet by mouth every 6 (six) hours as needed for moderate pain.    Dispense:  28 tablet    Refill:  0  . cephALEXin (KEFLEX) 500 MG capsule    Sig: Take 1 capsule (500 mg total) by mouth 2 (two) times daily.    Dispense:  14 capsule    Refill:  0

## 2017-05-02 NOTE — Progress Notes (Signed)
Patient ID: Heather Cline, female   DOB: Nov 26, 1935, 81 y.o.   MRN: [id number]  POSTOP VISIT   Chief Complaint  Patient presents with  . Routine Post Op    left femur fx     81-YEAR-OLD FEMALE STATUS PST OPEN TREATMENT INTERNAL FIXATION LEFTINERTROCHANTERC FRACTURE  The x-ray today shows that the fracture is in good position as is the hardware  She will take some medication for staple erythema for 1 week  Follow-up 4 weeks x-ray       Encounter Diagnoses  Name Primary?  . Closed displaced intertrochanteric fracture of left femur with routine healing, subsequent encounter   . Postoperative cellulitis of surgical wound, initial encounter Yes    Meds ordered this encounter  Medications  . HYDROcodone-acetaminophen (NORCO/VICODIN) 5-325 MG tablet    Sig: Take 1 tablet by mouth every 6 (six) hours as needed for moderate pain.    Dispense:  28 tablet    Refill:  0  . cephALEXin (KEFLEX) 500 MG capsule    Sig: Take 1 capsule (500 mg total) by mouth 2 (two) times daily.    Dispense:  14 capsule    Refill:  0   FU 4 WEEKS XRAYS LEFT HIP   10:45 AM Fuller CanadaStanley Lashauna Arpin, MD 05/02/2017

## 2017-05-04 DIAGNOSIS — I129 Hypertensive chronic kidney disease with stage 1 through stage 4 chronic kidney disease, or unspecified chronic kidney disease: Secondary | ICD-10-CM | POA: Diagnosis not present

## 2017-05-04 DIAGNOSIS — R000 Tachycardia, unspecified: Secondary | ICD-10-CM | POA: Diagnosis not present

## 2017-05-04 DIAGNOSIS — Z79899 Other long term (current) drug therapy: Secondary | ICD-10-CM | POA: Diagnosis not present

## 2017-05-04 DIAGNOSIS — W0110XD Fall on same level from slipping, tripping and stumbling with subsequent striking against unspecified object, subsequent encounter: Secondary | ICD-10-CM | POA: Diagnosis not present

## 2017-05-04 DIAGNOSIS — D631 Anemia in chronic kidney disease: Secondary | ICD-10-CM | POA: Diagnosis not present

## 2017-05-04 DIAGNOSIS — S72142D Displaced intertrochanteric fracture of left femur, subsequent encounter for closed fracture with routine healing: Secondary | ICD-10-CM | POA: Diagnosis not present

## 2017-05-04 DIAGNOSIS — N183 Chronic kidney disease, stage 3 (moderate): Secondary | ICD-10-CM | POA: Diagnosis not present

## 2017-05-05 DIAGNOSIS — W0110XD Fall on same level from slipping, tripping and stumbling with subsequent striking against unspecified object, subsequent encounter: Secondary | ICD-10-CM | POA: Diagnosis not present

## 2017-05-05 DIAGNOSIS — Z79899 Other long term (current) drug therapy: Secondary | ICD-10-CM | POA: Diagnosis not present

## 2017-05-05 DIAGNOSIS — N183 Chronic kidney disease, stage 3 (moderate): Secondary | ICD-10-CM | POA: Diagnosis not present

## 2017-05-05 DIAGNOSIS — S72142D Displaced intertrochanteric fracture of left femur, subsequent encounter for closed fracture with routine healing: Secondary | ICD-10-CM | POA: Diagnosis not present

## 2017-05-05 DIAGNOSIS — D631 Anemia in chronic kidney disease: Secondary | ICD-10-CM | POA: Diagnosis not present

## 2017-05-05 DIAGNOSIS — I129 Hypertensive chronic kidney disease with stage 1 through stage 4 chronic kidney disease, or unspecified chronic kidney disease: Secondary | ICD-10-CM | POA: Diagnosis not present

## 2017-05-05 DIAGNOSIS — R000 Tachycardia, unspecified: Secondary | ICD-10-CM | POA: Diagnosis not present

## 2017-05-06 ENCOUNTER — Ambulatory Visit (INDEPENDENT_AMBULATORY_CARE_PROVIDER_SITE_OTHER): Payer: Self-pay | Admitting: Orthopedic Surgery

## 2017-05-06 ENCOUNTER — Encounter: Payer: Self-pay | Admitting: Orthopedic Surgery

## 2017-05-06 ENCOUNTER — Telehealth: Payer: Self-pay | Admitting: Orthopedic Surgery

## 2017-05-06 DIAGNOSIS — W0110XD Fall on same level from slipping, tripping and stumbling with subsequent striking against unspecified object, subsequent encounter: Secondary | ICD-10-CM | POA: Diagnosis not present

## 2017-05-06 DIAGNOSIS — S72142D Displaced intertrochanteric fracture of left femur, subsequent encounter for closed fracture with routine healing: Secondary | ICD-10-CM

## 2017-05-06 DIAGNOSIS — Z79899 Other long term (current) drug therapy: Secondary | ICD-10-CM | POA: Diagnosis not present

## 2017-05-06 DIAGNOSIS — N183 Chronic kidney disease, stage 3 (moderate): Secondary | ICD-10-CM | POA: Diagnosis not present

## 2017-05-06 DIAGNOSIS — R000 Tachycardia, unspecified: Secondary | ICD-10-CM | POA: Diagnosis not present

## 2017-05-06 DIAGNOSIS — D631 Anemia in chronic kidney disease: Secondary | ICD-10-CM | POA: Diagnosis not present

## 2017-05-06 DIAGNOSIS — Z4889 Encounter for other specified surgical aftercare: Secondary | ICD-10-CM

## 2017-05-06 DIAGNOSIS — I129 Hypertensive chronic kidney disease with stage 1 through stage 4 chronic kidney disease, or unspecified chronic kidney disease: Secondary | ICD-10-CM | POA: Diagnosis not present

## 2017-05-06 MED ORDER — DOXYCYCLINE HYCLATE 100 MG PO TABS: 100.0000 mg | ORAL_TABLET | Freq: Two times a day (BID) | ORAL | 1 refills | Status: DC

## 2017-05-06 NOTE — Telephone Encounter (Signed)
Patient's caregiver call this afternoon at 2:15 stating that Heather Cline's hip has "pus pockets and pus around the incision."  She said the home health nurse told them this morning that she needed to see the doctor.  I told her to bring Heather Cline right on in.  I told her that she needs to see Dr. Romeo AppleHarrison this afternoon.  She said she would bring her right on to the office.

## 2017-05-06 NOTE — Progress Notes (Signed)
POST OP  Chief Complaint  Patient presents with  . Follow-up    Recheck on left hip, DOS 04-18-17.    The patient's hip had some staple erythema which I thought may become cellulitic I put her on Keflex on the 29th. Doesn't look like it got any better someone to switch over to doxycycline  The proximal and distal incisions have surrounding erythema no drainage the areas where the staples came out have pustular areas. No expressible purulence on palpation no palpable abscess  Meds ordered this encounter  Medications  . doxycycline (VIBRA-TABS) 100 MG tablet    Sig: Take 1 tablet (100 mg total) by mouth 2 (two) times daily.    Dispense:  28 tablet    Refill:  1    Follow-up 2 weeks

## 2017-05-06 NOTE — Patient Instructions (Signed)
Stop Keflex start doxycycline  Meds ordered this encounter  Medications  . doxycycline (VIBRA-TABS) 100 MG tablet    Sig: Take 1 tablet (100 mg total) by mouth 2 (two) times daily.    Dispense:  28 tablet    Refill:  1

## 2017-05-08 DIAGNOSIS — W0110XD Fall on same level from slipping, tripping and stumbling with subsequent striking against unspecified object, subsequent encounter: Secondary | ICD-10-CM | POA: Diagnosis not present

## 2017-05-08 DIAGNOSIS — Z79899 Other long term (current) drug therapy: Secondary | ICD-10-CM | POA: Diagnosis not present

## 2017-05-08 DIAGNOSIS — I129 Hypertensive chronic kidney disease with stage 1 through stage 4 chronic kidney disease, or unspecified chronic kidney disease: Secondary | ICD-10-CM | POA: Diagnosis not present

## 2017-05-08 DIAGNOSIS — R000 Tachycardia, unspecified: Secondary | ICD-10-CM | POA: Diagnosis not present

## 2017-05-08 DIAGNOSIS — S72142D Displaced intertrochanteric fracture of left femur, subsequent encounter for closed fracture with routine healing: Secondary | ICD-10-CM | POA: Diagnosis not present

## 2017-05-08 DIAGNOSIS — N183 Chronic kidney disease, stage 3 (moderate): Secondary | ICD-10-CM | POA: Diagnosis not present

## 2017-05-08 DIAGNOSIS — D631 Anemia in chronic kidney disease: Secondary | ICD-10-CM | POA: Diagnosis not present

## 2017-05-12 DIAGNOSIS — R000 Tachycardia, unspecified: Secondary | ICD-10-CM | POA: Diagnosis not present

## 2017-05-12 DIAGNOSIS — Z79899 Other long term (current) drug therapy: Secondary | ICD-10-CM | POA: Diagnosis not present

## 2017-05-12 DIAGNOSIS — W0110XD Fall on same level from slipping, tripping and stumbling with subsequent striking against unspecified object, subsequent encounter: Secondary | ICD-10-CM | POA: Diagnosis not present

## 2017-05-12 DIAGNOSIS — D631 Anemia in chronic kidney disease: Secondary | ICD-10-CM | POA: Diagnosis not present

## 2017-05-12 DIAGNOSIS — I129 Hypertensive chronic kidney disease with stage 1 through stage 4 chronic kidney disease, or unspecified chronic kidney disease: Secondary | ICD-10-CM | POA: Diagnosis not present

## 2017-05-12 DIAGNOSIS — N183 Chronic kidney disease, stage 3 (moderate): Secondary | ICD-10-CM | POA: Diagnosis not present

## 2017-05-12 DIAGNOSIS — S72142D Displaced intertrochanteric fracture of left femur, subsequent encounter for closed fracture with routine healing: Secondary | ICD-10-CM | POA: Diagnosis not present

## 2017-05-13 DIAGNOSIS — R000 Tachycardia, unspecified: Secondary | ICD-10-CM | POA: Diagnosis not present

## 2017-05-13 DIAGNOSIS — S72142D Displaced intertrochanteric fracture of left femur, subsequent encounter for closed fracture with routine healing: Secondary | ICD-10-CM | POA: Diagnosis not present

## 2017-05-13 DIAGNOSIS — I129 Hypertensive chronic kidney disease with stage 1 through stage 4 chronic kidney disease, or unspecified chronic kidney disease: Secondary | ICD-10-CM | POA: Diagnosis not present

## 2017-05-13 DIAGNOSIS — W0110XD Fall on same level from slipping, tripping and stumbling with subsequent striking against unspecified object, subsequent encounter: Secondary | ICD-10-CM | POA: Diagnosis not present

## 2017-05-13 DIAGNOSIS — N183 Chronic kidney disease, stage 3 (moderate): Secondary | ICD-10-CM | POA: Diagnosis not present

## 2017-05-13 DIAGNOSIS — Z79899 Other long term (current) drug therapy: Secondary | ICD-10-CM | POA: Diagnosis not present

## 2017-05-13 DIAGNOSIS — D631 Anemia in chronic kidney disease: Secondary | ICD-10-CM | POA: Diagnosis not present

## 2017-05-14 DIAGNOSIS — I129 Hypertensive chronic kidney disease with stage 1 through stage 4 chronic kidney disease, or unspecified chronic kidney disease: Secondary | ICD-10-CM | POA: Diagnosis not present

## 2017-05-14 DIAGNOSIS — R000 Tachycardia, unspecified: Secondary | ICD-10-CM | POA: Diagnosis not present

## 2017-05-14 DIAGNOSIS — D631 Anemia in chronic kidney disease: Secondary | ICD-10-CM | POA: Diagnosis not present

## 2017-05-14 DIAGNOSIS — W0110XD Fall on same level from slipping, tripping and stumbling with subsequent striking against unspecified object, subsequent encounter: Secondary | ICD-10-CM | POA: Diagnosis not present

## 2017-05-14 DIAGNOSIS — Z79899 Other long term (current) drug therapy: Secondary | ICD-10-CM | POA: Diagnosis not present

## 2017-05-14 DIAGNOSIS — N183 Chronic kidney disease, stage 3 (moderate): Secondary | ICD-10-CM | POA: Diagnosis not present

## 2017-05-14 DIAGNOSIS — S72142D Displaced intertrochanteric fracture of left femur, subsequent encounter for closed fracture with routine healing: Secondary | ICD-10-CM | POA: Diagnosis not present

## 2017-05-15 DIAGNOSIS — D631 Anemia in chronic kidney disease: Secondary | ICD-10-CM | POA: Diagnosis not present

## 2017-05-15 DIAGNOSIS — S72142D Displaced intertrochanteric fracture of left femur, subsequent encounter for closed fracture with routine healing: Secondary | ICD-10-CM | POA: Diagnosis not present

## 2017-05-15 DIAGNOSIS — N183 Chronic kidney disease, stage 3 (moderate): Secondary | ICD-10-CM | POA: Diagnosis not present

## 2017-05-15 DIAGNOSIS — I129 Hypertensive chronic kidney disease with stage 1 through stage 4 chronic kidney disease, or unspecified chronic kidney disease: Secondary | ICD-10-CM | POA: Diagnosis not present

## 2017-05-15 DIAGNOSIS — W0110XD Fall on same level from slipping, tripping and stumbling with subsequent striking against unspecified object, subsequent encounter: Secondary | ICD-10-CM | POA: Diagnosis not present

## 2017-05-15 DIAGNOSIS — R000 Tachycardia, unspecified: Secondary | ICD-10-CM | POA: Diagnosis not present

## 2017-05-15 DIAGNOSIS — Z79899 Other long term (current) drug therapy: Secondary | ICD-10-CM | POA: Diagnosis not present

## 2017-05-16 DIAGNOSIS — I129 Hypertensive chronic kidney disease with stage 1 through stage 4 chronic kidney disease, or unspecified chronic kidney disease: Secondary | ICD-10-CM | POA: Diagnosis not present

## 2017-05-16 DIAGNOSIS — W0110XD Fall on same level from slipping, tripping and stumbling with subsequent striking against unspecified object, subsequent encounter: Secondary | ICD-10-CM | POA: Diagnosis not present

## 2017-05-16 DIAGNOSIS — S72142D Displaced intertrochanteric fracture of left femur, subsequent encounter for closed fracture with routine healing: Secondary | ICD-10-CM | POA: Diagnosis not present

## 2017-05-16 DIAGNOSIS — R000 Tachycardia, unspecified: Secondary | ICD-10-CM | POA: Diagnosis not present

## 2017-05-16 DIAGNOSIS — N183 Chronic kidney disease, stage 3 (moderate): Secondary | ICD-10-CM | POA: Diagnosis not present

## 2017-05-16 DIAGNOSIS — Z79899 Other long term (current) drug therapy: Secondary | ICD-10-CM | POA: Diagnosis not present

## 2017-05-16 DIAGNOSIS — D631 Anemia in chronic kidney disease: Secondary | ICD-10-CM | POA: Diagnosis not present

## 2017-05-20 ENCOUNTER — Encounter: Payer: Self-pay | Admitting: Orthopedic Surgery

## 2017-05-20 ENCOUNTER — Ambulatory Visit (INDEPENDENT_AMBULATORY_CARE_PROVIDER_SITE_OTHER): Payer: Self-pay | Admitting: Orthopedic Surgery

## 2017-05-20 DIAGNOSIS — Z4889 Encounter for other specified surgical aftercare: Secondary | ICD-10-CM

## 2017-05-20 DIAGNOSIS — R000 Tachycardia, unspecified: Secondary | ICD-10-CM | POA: Diagnosis not present

## 2017-05-20 DIAGNOSIS — Z79899 Other long term (current) drug therapy: Secondary | ICD-10-CM | POA: Diagnosis not present

## 2017-05-20 DIAGNOSIS — S72142D Displaced intertrochanteric fracture of left femur, subsequent encounter for closed fracture with routine healing: Secondary | ICD-10-CM | POA: Diagnosis not present

## 2017-05-20 DIAGNOSIS — IMO0001 Reserved for inherently not codable concepts without codable children: Secondary | ICD-10-CM

## 2017-05-20 DIAGNOSIS — W0110XD Fall on same level from slipping, tripping and stumbling with subsequent striking against unspecified object, subsequent encounter: Secondary | ICD-10-CM | POA: Diagnosis not present

## 2017-05-20 DIAGNOSIS — N183 Chronic kidney disease, stage 3 (moderate): Secondary | ICD-10-CM | POA: Diagnosis not present

## 2017-05-20 DIAGNOSIS — T814XXA Infection following a procedure, initial encounter: Secondary | ICD-10-CM

## 2017-05-20 DIAGNOSIS — D631 Anemia in chronic kidney disease: Secondary | ICD-10-CM | POA: Diagnosis not present

## 2017-05-20 DIAGNOSIS — I129 Hypertensive chronic kidney disease with stage 1 through stage 4 chronic kidney disease, or unspecified chronic kidney disease: Secondary | ICD-10-CM | POA: Diagnosis not present

## 2017-05-20 NOTE — Progress Notes (Signed)
Routine follow up   Encounter Diagnoses  Name Primary?  . Closed displaced intertrochanteric fracture of left femur with routine healing, subsequent encounter   . Aftercare following surgery   . Postoperative cellulitis of surgical wound, initial encounter Yes   Chief Complaint  Patient presents with  . Follow-up    Lt hip IM Nail, DOS 04/18/17   81 year old female now in her fourth week after intramedullary nailing on June 15 developed a postop cellulitis. We tried her on Keflex she didn't improve we put her on doxycycline and she's here for follow-up to check that.  The wound is now pristine  The patient is ambulatory without assistive device and will get an x-ray at postop week 6

## 2017-05-22 DIAGNOSIS — I129 Hypertensive chronic kidney disease with stage 1 through stage 4 chronic kidney disease, or unspecified chronic kidney disease: Secondary | ICD-10-CM | POA: Diagnosis not present

## 2017-05-22 DIAGNOSIS — D631 Anemia in chronic kidney disease: Secondary | ICD-10-CM | POA: Diagnosis not present

## 2017-05-22 DIAGNOSIS — Z79899 Other long term (current) drug therapy: Secondary | ICD-10-CM | POA: Diagnosis not present

## 2017-05-22 DIAGNOSIS — R000 Tachycardia, unspecified: Secondary | ICD-10-CM | POA: Diagnosis not present

## 2017-05-22 DIAGNOSIS — S72142A Displaced intertrochanteric fracture of left femur, initial encounter for closed fracture: Secondary | ICD-10-CM | POA: Diagnosis not present

## 2017-05-22 DIAGNOSIS — S72142D Displaced intertrochanteric fracture of left femur, subsequent encounter for closed fracture with routine healing: Secondary | ICD-10-CM | POA: Diagnosis not present

## 2017-05-22 DIAGNOSIS — W0110XD Fall on same level from slipping, tripping and stumbling with subsequent striking against unspecified object, subsequent encounter: Secondary | ICD-10-CM | POA: Diagnosis not present

## 2017-05-22 DIAGNOSIS — N183 Chronic kidney disease, stage 3 (moderate): Secondary | ICD-10-CM | POA: Diagnosis not present

## 2017-05-28 DIAGNOSIS — W0110XD Fall on same level from slipping, tripping and stumbling with subsequent striking against unspecified object, subsequent encounter: Secondary | ICD-10-CM | POA: Diagnosis not present

## 2017-05-28 DIAGNOSIS — D631 Anemia in chronic kidney disease: Secondary | ICD-10-CM | POA: Diagnosis not present

## 2017-05-28 DIAGNOSIS — I129 Hypertensive chronic kidney disease with stage 1 through stage 4 chronic kidney disease, or unspecified chronic kidney disease: Secondary | ICD-10-CM | POA: Diagnosis not present

## 2017-05-28 DIAGNOSIS — R000 Tachycardia, unspecified: Secondary | ICD-10-CM | POA: Diagnosis not present

## 2017-05-28 DIAGNOSIS — N183 Chronic kidney disease, stage 3 (moderate): Secondary | ICD-10-CM | POA: Diagnosis not present

## 2017-05-28 DIAGNOSIS — Z79899 Other long term (current) drug therapy: Secondary | ICD-10-CM | POA: Diagnosis not present

## 2017-05-28 DIAGNOSIS — S72142D Displaced intertrochanteric fracture of left femur, subsequent encounter for closed fracture with routine healing: Secondary | ICD-10-CM | POA: Diagnosis not present

## 2017-05-30 ENCOUNTER — Ambulatory Visit (INDEPENDENT_AMBULATORY_CARE_PROVIDER_SITE_OTHER): Payer: Self-pay | Admitting: Orthopedic Surgery

## 2017-05-30 ENCOUNTER — Ambulatory Visit (INDEPENDENT_AMBULATORY_CARE_PROVIDER_SITE_OTHER): Payer: Medicare Other

## 2017-05-30 DIAGNOSIS — S72142D Displaced intertrochanteric fracture of left femur, subsequent encounter for closed fracture with routine healing: Secondary | ICD-10-CM

## 2017-05-30 NOTE — Progress Notes (Signed)
Encounter Diagnosis  Name Primary?  . Closed displaced intertrochanteric fracture of left femur with routine healing, subsequent encounter Yes    Postop visit status post I am nail left hip with gamma nail for intertrochanteric fracture  Date of surgery 04/18/2017  This is essentially the six-week postop visit  The patient did have some wound problems which were treated with oral antibiotics successfully  She is ambulating with a slight limp and she does use a cane  X-ray today shows stable fixation of the left hip fracture with the gamma nail with no complications  X-ray in 6 weeks

## 2017-07-14 ENCOUNTER — Ambulatory Visit (INDEPENDENT_AMBULATORY_CARE_PROVIDER_SITE_OTHER): Payer: Medicare Other | Admitting: Orthopedic Surgery

## 2017-07-14 ENCOUNTER — Ambulatory Visit (INDEPENDENT_AMBULATORY_CARE_PROVIDER_SITE_OTHER): Payer: Medicare Other

## 2017-07-14 DIAGNOSIS — S72142D Displaced intertrochanteric fracture of left femur, subsequent encounter for closed fracture with routine healing: Secondary | ICD-10-CM | POA: Diagnosis not present

## 2017-07-14 DIAGNOSIS — Z4889 Encounter for other specified surgical aftercare: Secondary | ICD-10-CM

## 2017-07-14 NOTE — Progress Notes (Signed)
Postoperative visit  Chief complaint left hip surgery follow-up  Date of surgery 04/18/2017; postop day #87 Procedure open treatment internal fixation left gamma nail  The patient has some dementia.  Her daughter does tell us that she does have some trouble with gait at times shuffling her feet.  But overall she is doing very well her x-rays show fracture healing without hardware complication  We released her to come back as needed  Encounter Diagnoses  Name Primary?  . Closed displaced intertrochanteric fracture of left femur with routine healing, subsequent encounter   . Aftercare following surgery Yes

## 2017-08-04 DIAGNOSIS — E785 Hyperlipidemia, unspecified: Secondary | ICD-10-CM | POA: Diagnosis not present

## 2017-08-11 DIAGNOSIS — Z0000 Encounter for general adult medical examination without abnormal findings: Secondary | ICD-10-CM | POA: Diagnosis not present

## 2017-08-11 DIAGNOSIS — Z23 Encounter for immunization: Secondary | ICD-10-CM | POA: Diagnosis not present

## 2017-08-11 DIAGNOSIS — G309 Alzheimer's disease, unspecified: Secondary | ICD-10-CM | POA: Diagnosis not present

## 2017-08-11 DIAGNOSIS — I499 Cardiac arrhythmia, unspecified: Secondary | ICD-10-CM | POA: Diagnosis not present

## 2017-08-11 DIAGNOSIS — E46 Unspecified protein-calorie malnutrition: Secondary | ICD-10-CM | POA: Diagnosis not present

## 2017-10-06 ENCOUNTER — Other Ambulatory Visit (HOSPITAL_COMMUNITY): Payer: Self-pay | Admitting: Internal Medicine

## 2017-10-06 DIAGNOSIS — Z780 Asymptomatic menopausal state: Secondary | ICD-10-CM

## 2017-10-15 ENCOUNTER — Other Ambulatory Visit (HOSPITAL_COMMUNITY): Payer: Medicare Other

## 2018-01-20 DIAGNOSIS — L02421 Furuncle of right axilla: Secondary | ICD-10-CM | POA: Diagnosis not present

## 2018-04-01 DIAGNOSIS — M270 Developmental disorders of jaws: Secondary | ICD-10-CM | POA: Diagnosis not present

## 2018-04-01 DIAGNOSIS — L723 Sebaceous cyst: Secondary | ICD-10-CM | POA: Diagnosis not present

## 2018-05-20 DIAGNOSIS — I739 Peripheral vascular disease, unspecified: Secondary | ICD-10-CM | POA: Diagnosis not present

## 2018-05-20 DIAGNOSIS — B351 Tinea unguium: Secondary | ICD-10-CM | POA: Diagnosis not present

## 2018-08-11 DIAGNOSIS — Z0000 Encounter for general adult medical examination without abnormal findings: Secondary | ICD-10-CM | POA: Diagnosis not present

## 2018-08-11 DIAGNOSIS — M25559 Pain in unspecified hip: Secondary | ICD-10-CM | POA: Diagnosis not present

## 2018-08-11 DIAGNOSIS — E43 Unspecified severe protein-calorie malnutrition: Secondary | ICD-10-CM | POA: Diagnosis not present

## 2018-11-08 IMAGING — DX DG HIP (WITH OR WITHOUT PELVIS) 2-3V*L*
3 series · 3 of 3 positions shown · non-contrast
Comparison: None.

CLINICAL DATA: Patient fell onto left side.  Left hip fracture.

EXAM:
DG HIP (WITH OR WITHOUT PELVIS) 2-3V LEFT

[pelvis ap]
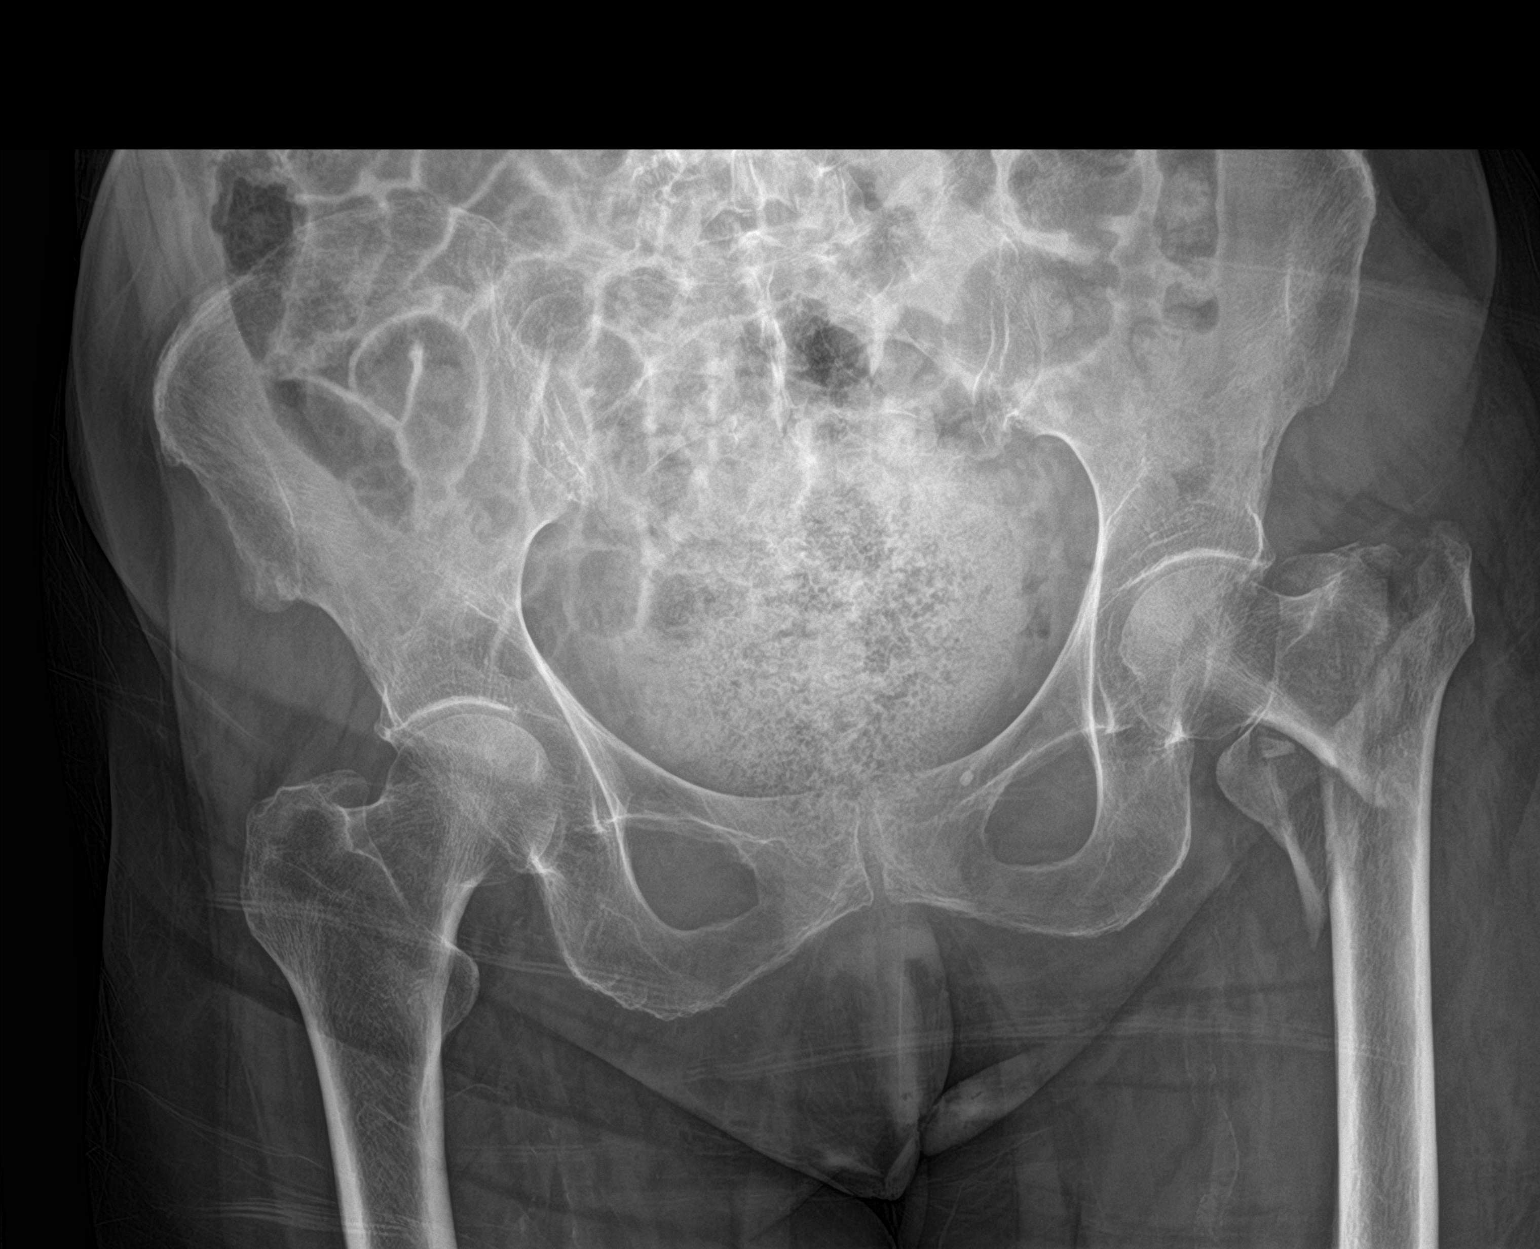

[hip ap]
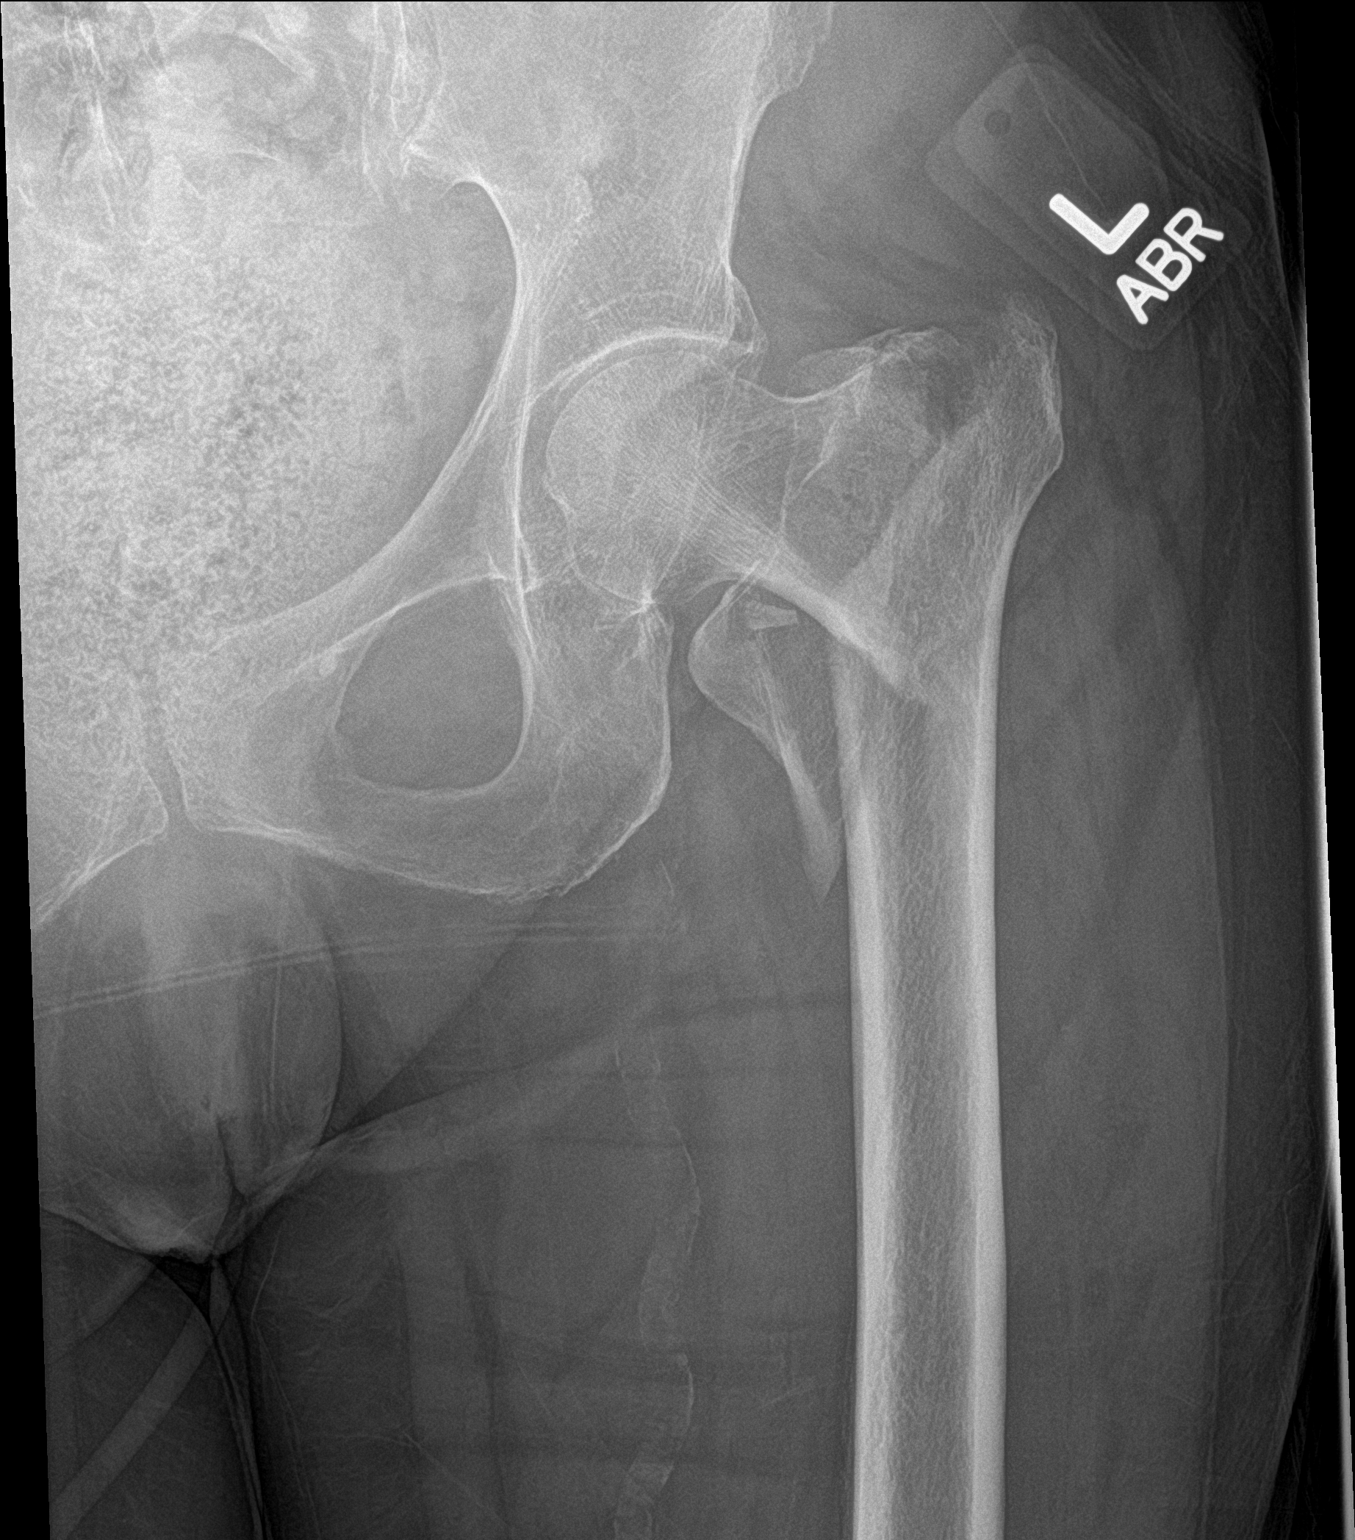

[hip lat]
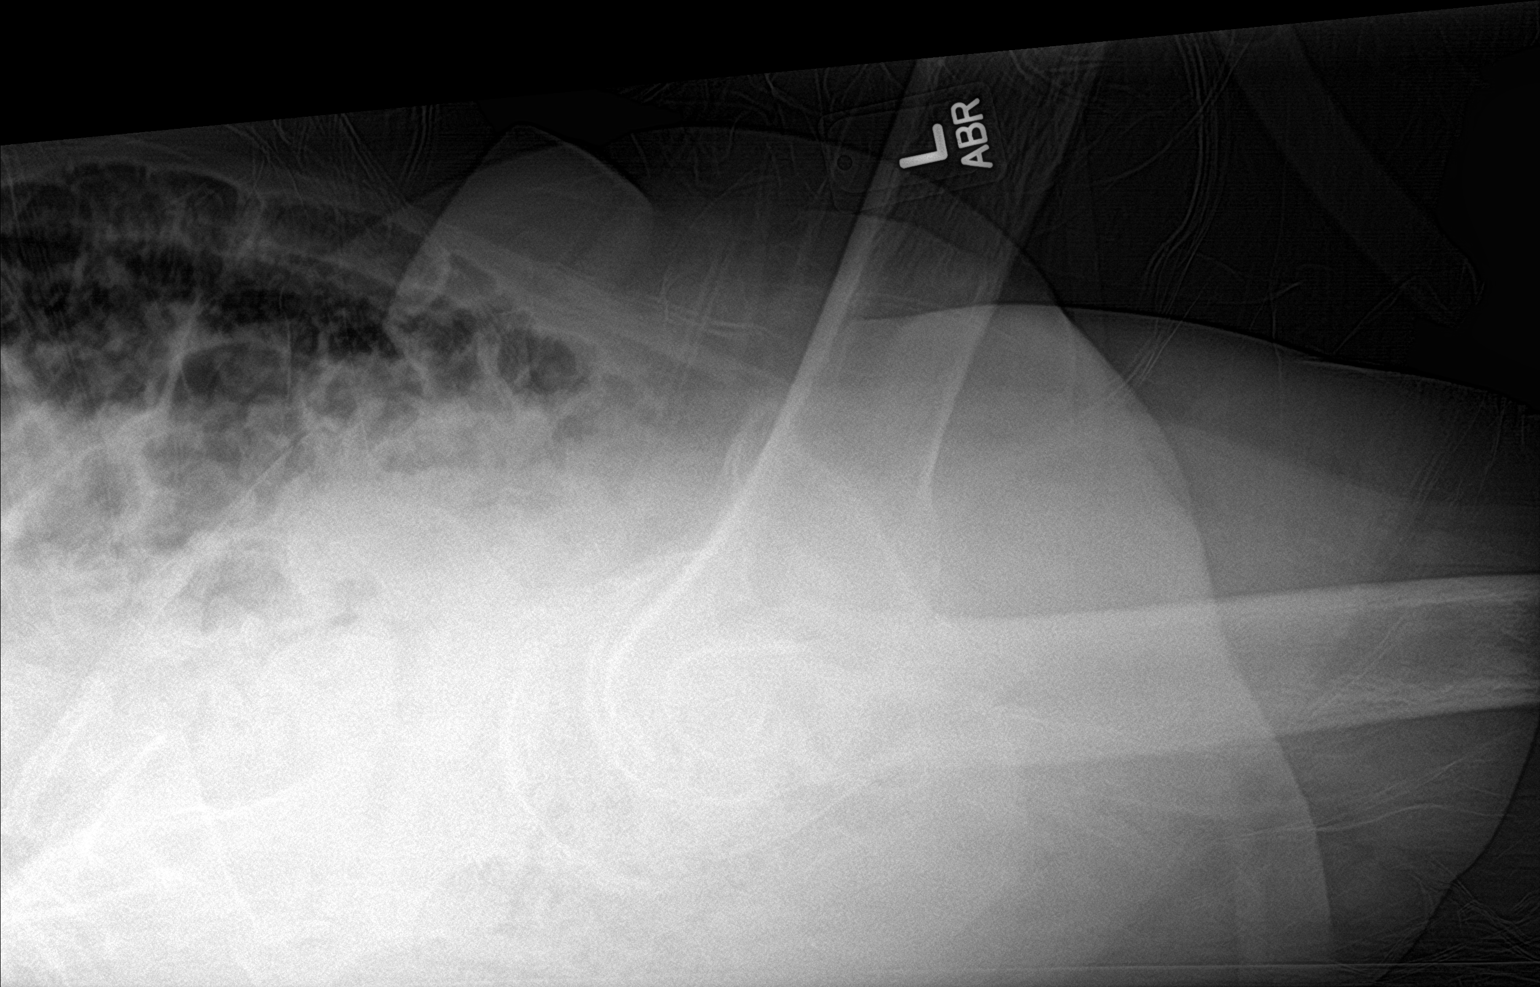

[3 of 3 positions shown; findings below may reference images not displayed]

FINDINGS: An acute, closed, varus angulated intertrochanteric fracture of the
left femur is noted with avulsed lesser trochanter. The femoral head
is seated within the left hip joint with joint space narrowing
noted. The bony pelvis appears grossly intact. A large stool ball
projects over the mid pelvis obscuring the lower sacrum and coccyx.
IMPRESSION: 1. Acute, closed, varus angulated intertrochanteric fracture of the
left femur with avulsed lesser trochanter. No hip joint dislocation.
2. Intact appearing bony pelvis given limitations of what appears to
be fecal impaction in the mid pelvis.

## 2018-11-08 IMAGING — DX DG CHEST 1V
1 series · 1 of 1 positions shown · non-contrast
Comparison: None.

CLINICAL DATA: Patient fell onto left side. History of Alzheimer's.
Fracture of the hip. Preop.

EXAM:
CHEST 1 VIEW

[chest ap]
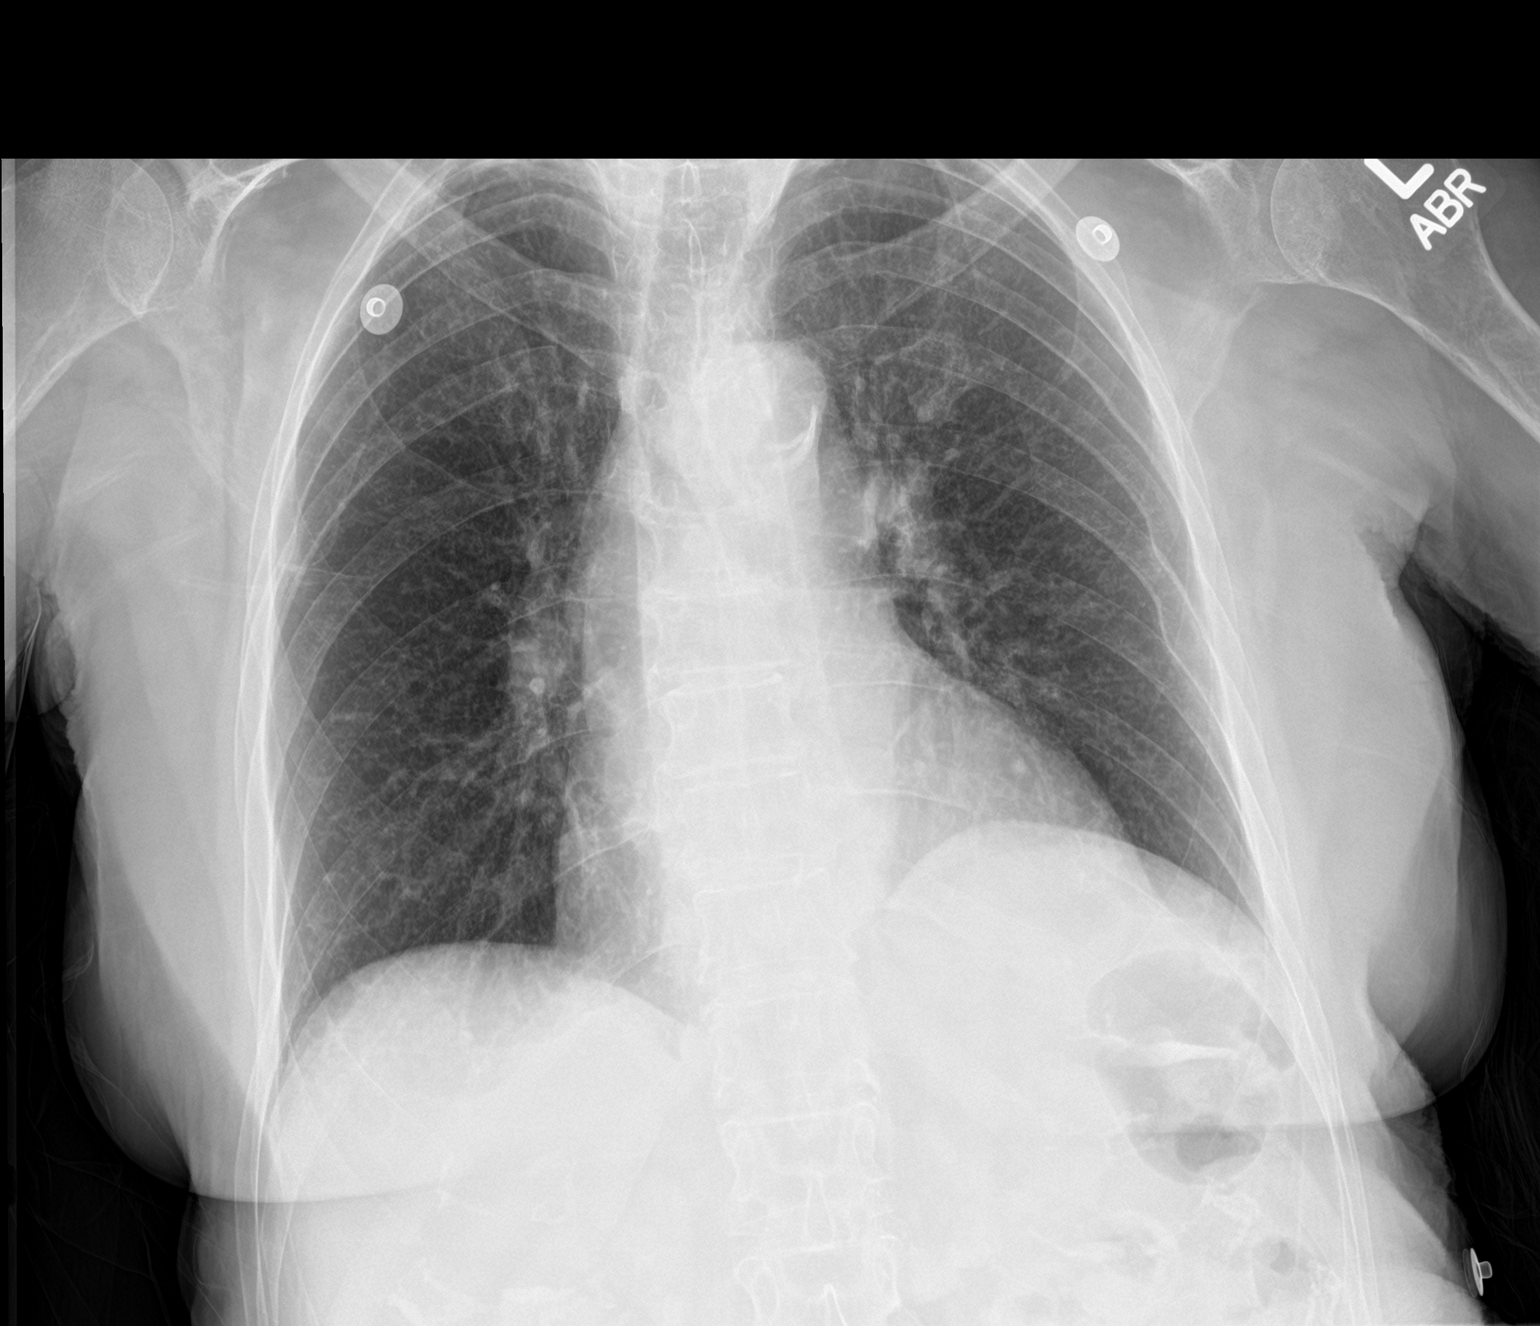

[1 of 1 positions shown; findings below may reference images not displayed]

FINDINGS: Cardiomegaly with aortic atherosclerosis. Lungs are clear without
effusion or pneumothorax. No pulmonary consolidation or contusion.
Old left-sided fifth through seventh rib fractures. No acute osseous
abnormality.
IMPRESSION: 1. Cardiomegaly with aortic atherosclerosis.
2. No acute pulmonary disease.

## 2018-11-09 IMAGING — RF DG HIP (WITH PELVIS) OPERATIVE*L*
1 series · 8 of 8 positions shown · non-contrast
Comparison: Radiographs April 17, 2017.

CLINICAL DATA: Open reduction and internal fixation of left hip
fracture.

EXAM:
OPERATIVE left HIP (WITH PELVIS IF PERFORMED) 8 VIEWS
TECHNIQUE: Fluoroscopic spot image(s) were submitted for interpretation
post-operatively.
FLUOROSCOPY TIME:  1 minutes 30 seconds.

[Series 1: run · 8 of 8 slices shown]
[im 1/8]
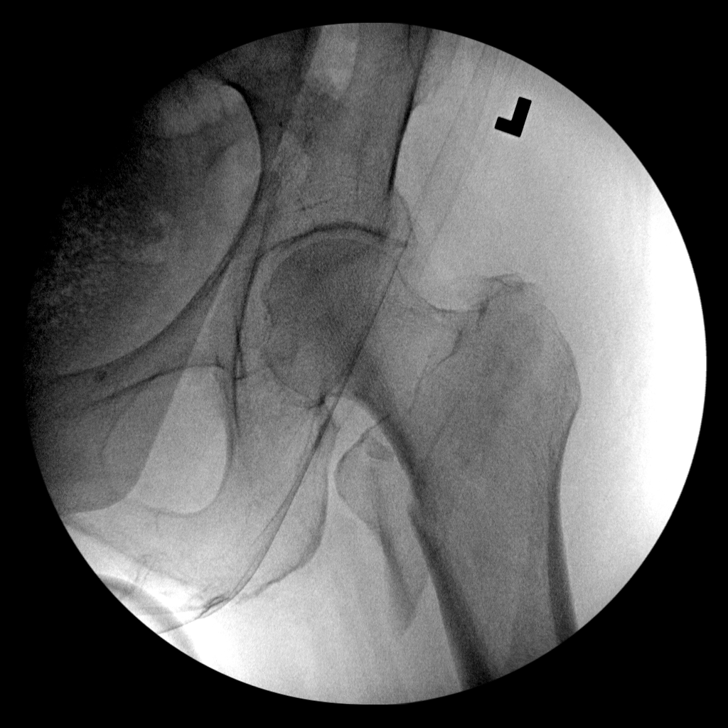
[im 2/8]
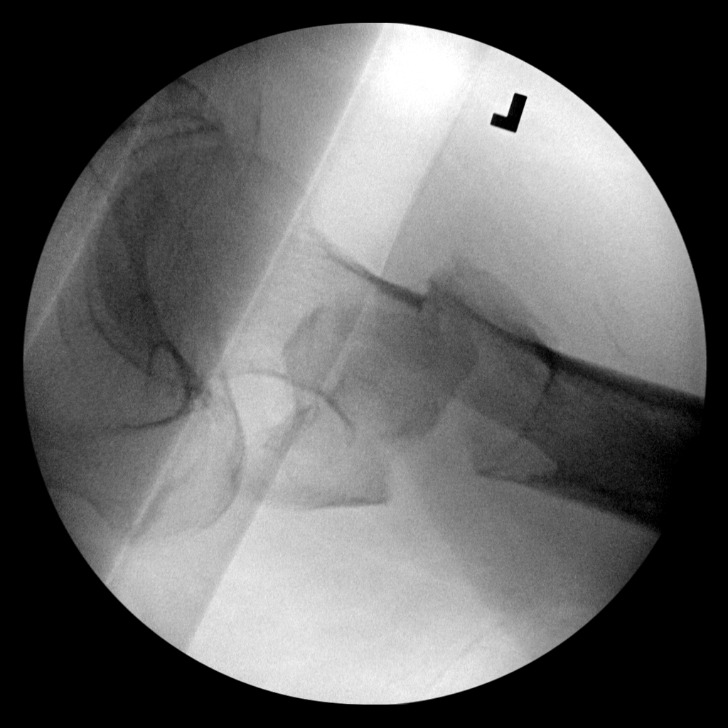
[im 3/8]
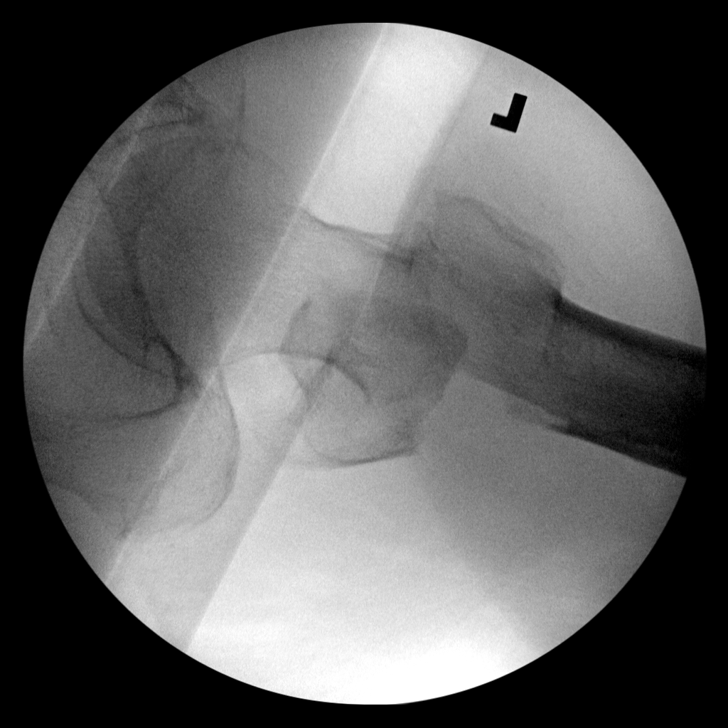
[im 4/8]
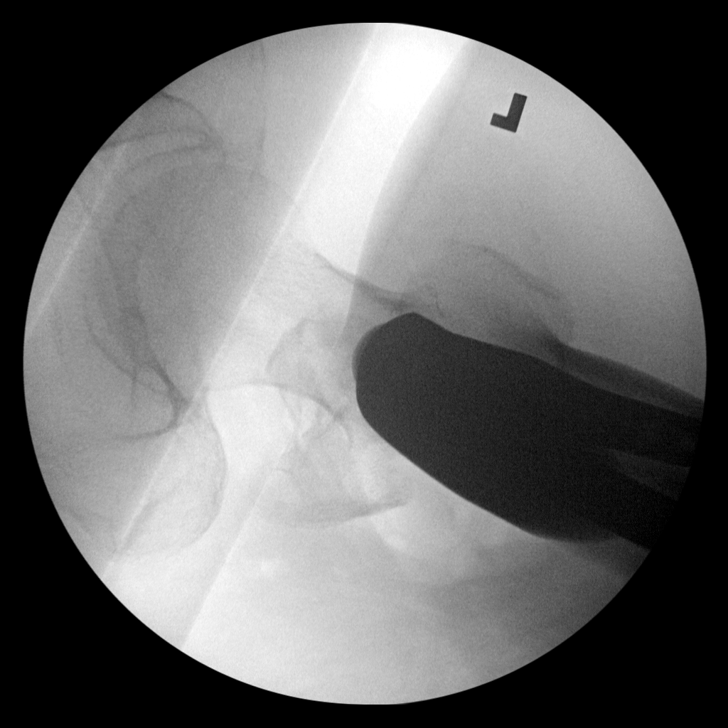
[im 5/8]
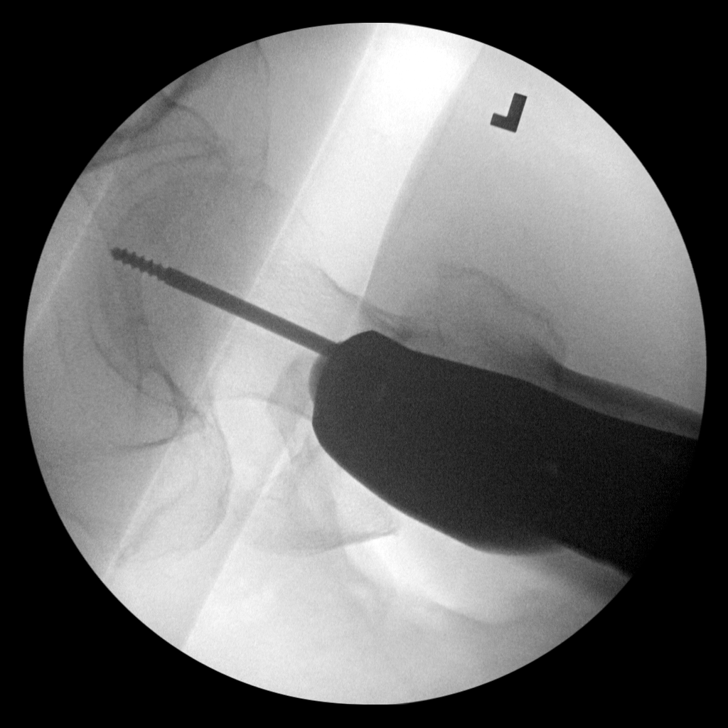
[im 6/8]
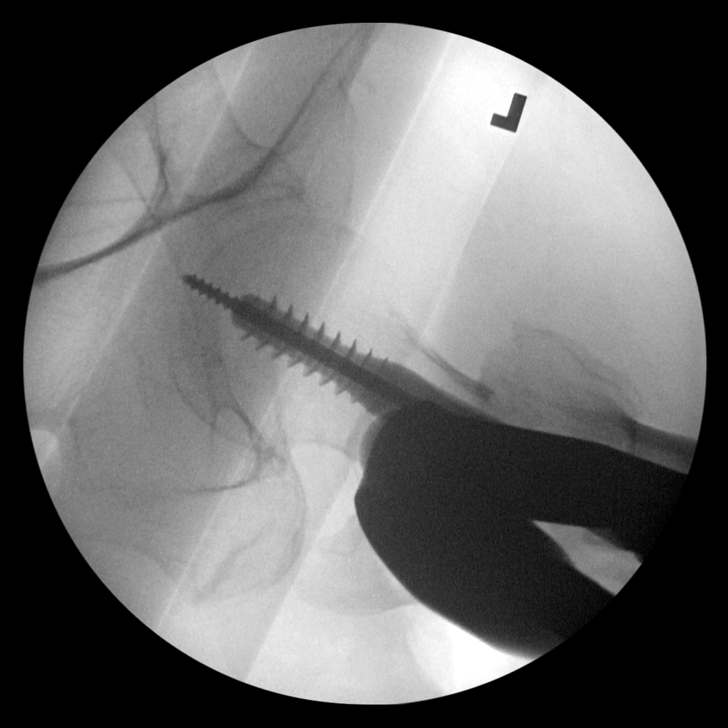
[im 7/8]
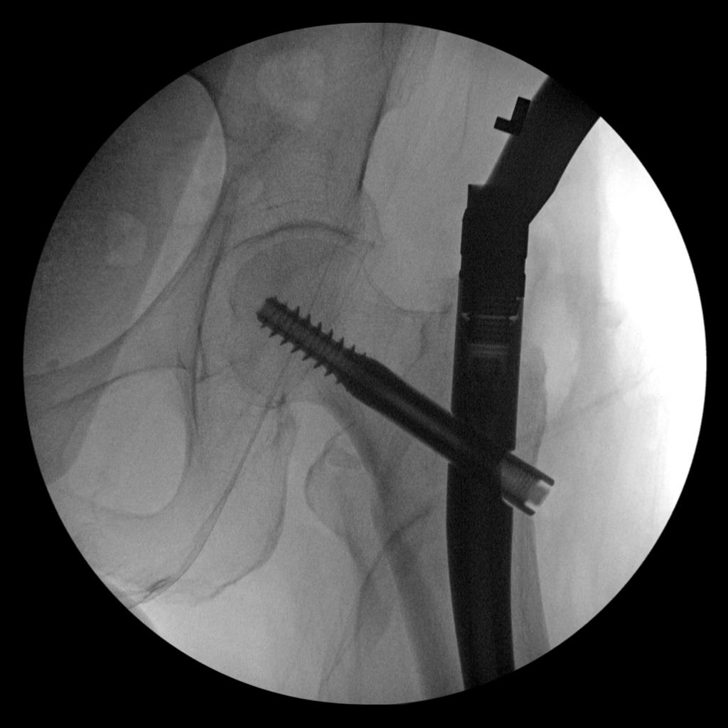
[im 8/8]
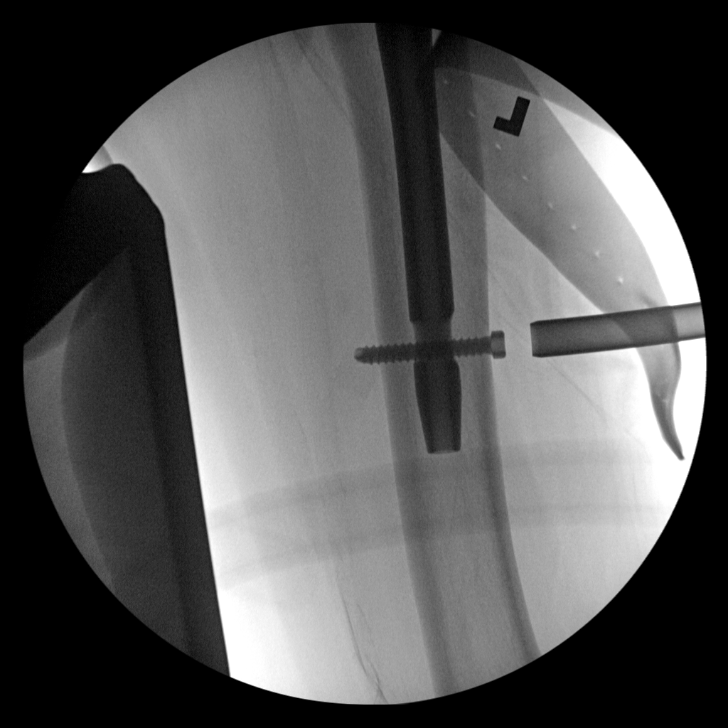

[8 of 8 positions shown; findings below may reference images not displayed]

FINDINGS: Eight intraoperative fluoroscopic images of the left hip demonstrate
internal fixation of comminuted intertrochanteric fracture of the
proximal left femur.
IMPRESSION: Status post reduction and internal fixation of proximal left femoral
intertrochanteric fracture.

## 2018-11-19 DIAGNOSIS — J060 Acute laryngopharyngitis: Secondary | ICD-10-CM | POA: Diagnosis not present

## 2019-03-15 DIAGNOSIS — Z0000 Encounter for general adult medical examination without abnormal findings: Secondary | ICD-10-CM | POA: Diagnosis not present

## 2020-02-27 DIAGNOSIS — Z66 Do not resuscitate: Secondary | ICD-10-CM | POA: Diagnosis not present

## 2020-02-27 DIAGNOSIS — M25551 Pain in right hip: Secondary | ICD-10-CM | POA: Diagnosis not present

## 2020-02-27 DIAGNOSIS — I4891 Unspecified atrial fibrillation: Secondary | ICD-10-CM | POA: Diagnosis not present

## 2020-02-27 DIAGNOSIS — Z20822 Contact with and (suspected) exposure to covid-19: Secondary | ICD-10-CM | POA: Diagnosis not present

## 2020-02-27 DIAGNOSIS — S72141A Displaced intertrochanteric fracture of right femur, initial encounter for closed fracture: Secondary | ICD-10-CM | POA: Diagnosis not present

## 2020-02-27 DIAGNOSIS — Z743 Need for continuous supervision: Secondary | ICD-10-CM | POA: Diagnosis not present

## 2020-02-27 DIAGNOSIS — W19XXXA Unspecified fall, initial encounter: Secondary | ICD-10-CM | POA: Diagnosis not present

## 2020-02-27 DIAGNOSIS — S72041A Displaced fracture of base of neck of right femur, initial encounter for closed fracture: Secondary | ICD-10-CM | POA: Diagnosis not present

## 2020-02-27 DIAGNOSIS — S72001A Fracture of unspecified part of neck of right femur, initial encounter for closed fracture: Secondary | ICD-10-CM | POA: Diagnosis not present

## 2020-02-27 DIAGNOSIS — Z79899 Other long term (current) drug therapy: Secondary | ICD-10-CM | POA: Diagnosis not present

## 2020-02-27 DIAGNOSIS — Z96642 Presence of left artificial hip joint: Secondary | ICD-10-CM | POA: Diagnosis not present

## 2020-02-27 DIAGNOSIS — I480 Paroxysmal atrial fibrillation: Secondary | ICD-10-CM | POA: Diagnosis not present

## 2020-02-27 DIAGNOSIS — M25572 Pain in left ankle and joints of left foot: Secondary | ICD-10-CM | POA: Diagnosis not present

## 2020-02-27 DIAGNOSIS — G309 Alzheimer's disease, unspecified: Secondary | ICD-10-CM | POA: Diagnosis not present

## 2020-02-29 DIAGNOSIS — S72001A Fracture of unspecified part of neck of right femur, initial encounter for closed fracture: Secondary | ICD-10-CM

## 2020-02-29 HISTORY — DX: Fracture of unspecified part of neck of right femur, initial encounter for closed fracture: S72.001A

## 2020-03-01 DIAGNOSIS — W19XXXD Unspecified fall, subsequent encounter: Secondary | ICD-10-CM | POA: Diagnosis not present

## 2020-03-01 DIAGNOSIS — Z9181 History of falling: Secondary | ICD-10-CM | POA: Diagnosis not present

## 2020-03-01 DIAGNOSIS — I4891 Unspecified atrial fibrillation: Secondary | ICD-10-CM | POA: Diagnosis not present

## 2020-03-01 DIAGNOSIS — S72001D Fracture of unspecified part of neck of right femur, subsequent encounter for closed fracture with routine healing: Secondary | ICD-10-CM | POA: Diagnosis not present

## 2020-03-01 DIAGNOSIS — G309 Alzheimer's disease, unspecified: Secondary | ICD-10-CM | POA: Diagnosis not present

## 2020-03-07 DIAGNOSIS — S72001D Fracture of unspecified part of neck of right femur, subsequent encounter for closed fracture with routine healing: Secondary | ICD-10-CM | POA: Diagnosis not present

## 2020-03-07 DIAGNOSIS — W19XXXD Unspecified fall, subsequent encounter: Secondary | ICD-10-CM | POA: Diagnosis not present

## 2020-03-07 DIAGNOSIS — Z9181 History of falling: Secondary | ICD-10-CM | POA: Diagnosis not present

## 2020-03-07 DIAGNOSIS — I4891 Unspecified atrial fibrillation: Secondary | ICD-10-CM | POA: Diagnosis not present

## 2020-03-07 DIAGNOSIS — G309 Alzheimer's disease, unspecified: Secondary | ICD-10-CM | POA: Diagnosis not present

## 2020-03-09 DIAGNOSIS — G309 Alzheimer's disease, unspecified: Secondary | ICD-10-CM | POA: Diagnosis not present

## 2020-03-09 DIAGNOSIS — S72001D Fracture of unspecified part of neck of right femur, subsequent encounter for closed fracture with routine healing: Secondary | ICD-10-CM | POA: Diagnosis not present

## 2020-03-09 DIAGNOSIS — I4891 Unspecified atrial fibrillation: Secondary | ICD-10-CM | POA: Diagnosis not present

## 2020-03-09 DIAGNOSIS — Z9181 History of falling: Secondary | ICD-10-CM | POA: Diagnosis not present

## 2020-03-09 DIAGNOSIS — W19XXXD Unspecified fall, subsequent encounter: Secondary | ICD-10-CM | POA: Diagnosis not present

## 2020-03-11 DIAGNOSIS — W19XXXD Unspecified fall, subsequent encounter: Secondary | ICD-10-CM | POA: Diagnosis not present

## 2020-03-11 DIAGNOSIS — Z9181 History of falling: Secondary | ICD-10-CM | POA: Diagnosis not present

## 2020-03-11 DIAGNOSIS — G309 Alzheimer's disease, unspecified: Secondary | ICD-10-CM | POA: Diagnosis not present

## 2020-03-11 DIAGNOSIS — S72001D Fracture of unspecified part of neck of right femur, subsequent encounter for closed fracture with routine healing: Secondary | ICD-10-CM | POA: Diagnosis not present

## 2020-03-11 DIAGNOSIS — I4891 Unspecified atrial fibrillation: Secondary | ICD-10-CM | POA: Diagnosis not present

## 2020-03-14 DIAGNOSIS — Z9181 History of falling: Secondary | ICD-10-CM | POA: Diagnosis not present

## 2020-03-14 DIAGNOSIS — W19XXXD Unspecified fall, subsequent encounter: Secondary | ICD-10-CM | POA: Diagnosis not present

## 2020-03-14 DIAGNOSIS — S72001D Fracture of unspecified part of neck of right femur, subsequent encounter for closed fracture with routine healing: Secondary | ICD-10-CM | POA: Diagnosis not present

## 2020-03-14 DIAGNOSIS — I4891 Unspecified atrial fibrillation: Secondary | ICD-10-CM | POA: Diagnosis not present

## 2020-03-14 DIAGNOSIS — G309 Alzheimer's disease, unspecified: Secondary | ICD-10-CM | POA: Diagnosis not present

## 2020-03-16 DIAGNOSIS — Z9181 History of falling: Secondary | ICD-10-CM | POA: Diagnosis not present

## 2020-03-16 DIAGNOSIS — I4891 Unspecified atrial fibrillation: Secondary | ICD-10-CM | POA: Diagnosis not present

## 2020-03-16 DIAGNOSIS — S72001D Fracture of unspecified part of neck of right femur, subsequent encounter for closed fracture with routine healing: Secondary | ICD-10-CM | POA: Diagnosis not present

## 2020-03-16 DIAGNOSIS — G309 Alzheimer's disease, unspecified: Secondary | ICD-10-CM | POA: Diagnosis not present

## 2020-03-16 DIAGNOSIS — W19XXXD Unspecified fall, subsequent encounter: Secondary | ICD-10-CM | POA: Diagnosis not present

## 2020-03-20 DIAGNOSIS — S72001D Fracture of unspecified part of neck of right femur, subsequent encounter for closed fracture with routine healing: Secondary | ICD-10-CM | POA: Diagnosis not present

## 2020-03-20 DIAGNOSIS — G309 Alzheimer's disease, unspecified: Secondary | ICD-10-CM | POA: Diagnosis not present

## 2020-03-20 DIAGNOSIS — Z9181 History of falling: Secondary | ICD-10-CM | POA: Diagnosis not present

## 2020-03-20 DIAGNOSIS — I4891 Unspecified atrial fibrillation: Secondary | ICD-10-CM | POA: Diagnosis not present

## 2020-03-20 DIAGNOSIS — W19XXXD Unspecified fall, subsequent encounter: Secondary | ICD-10-CM | POA: Diagnosis not present

## 2020-03-22 DIAGNOSIS — W19XXXD Unspecified fall, subsequent encounter: Secondary | ICD-10-CM | POA: Diagnosis not present

## 2020-03-22 DIAGNOSIS — Z9181 History of falling: Secondary | ICD-10-CM | POA: Diagnosis not present

## 2020-03-22 DIAGNOSIS — S72001D Fracture of unspecified part of neck of right femur, subsequent encounter for closed fracture with routine healing: Secondary | ICD-10-CM | POA: Diagnosis not present

## 2020-03-22 DIAGNOSIS — G309 Alzheimer's disease, unspecified: Secondary | ICD-10-CM | POA: Diagnosis not present

## 2020-03-22 DIAGNOSIS — I4891 Unspecified atrial fibrillation: Secondary | ICD-10-CM | POA: Diagnosis not present

## 2020-03-27 DIAGNOSIS — I4891 Unspecified atrial fibrillation: Secondary | ICD-10-CM | POA: Diagnosis not present

## 2020-03-27 DIAGNOSIS — Z9181 History of falling: Secondary | ICD-10-CM | POA: Diagnosis not present

## 2020-03-27 DIAGNOSIS — S72001D Fracture of unspecified part of neck of right femur, subsequent encounter for closed fracture with routine healing: Secondary | ICD-10-CM | POA: Diagnosis not present

## 2020-03-27 DIAGNOSIS — G309 Alzheimer's disease, unspecified: Secondary | ICD-10-CM | POA: Diagnosis not present

## 2020-03-27 DIAGNOSIS — W19XXXD Unspecified fall, subsequent encounter: Secondary | ICD-10-CM | POA: Diagnosis not present

## 2020-03-28 DIAGNOSIS — I4891 Unspecified atrial fibrillation: Secondary | ICD-10-CM | POA: Diagnosis not present

## 2020-03-28 DIAGNOSIS — Z136 Encounter for screening for cardiovascular disorders: Secondary | ICD-10-CM | POA: Diagnosis not present

## 2020-03-29 DIAGNOSIS — S72144D Nondisplaced intertrochanteric fracture of right femur, subsequent encounter for closed fracture with routine healing: Secondary | ICD-10-CM | POA: Diagnosis not present

## 2020-04-11 ENCOUNTER — Encounter: Payer: Self-pay | Admitting: *Deleted

## 2020-04-12 ENCOUNTER — Ambulatory Visit: Payer: Medicare Other | Admitting: Cardiology

## 2020-04-12 NOTE — Progress Notes (Deleted)
Clinical Summary Heather Cline is a 84 y.o.female  1. Afib - new diagnosis during recent admission later April 2021 to [facility] after presenting with mechanical fall, hip fracture that was repaired - due to recurrent fall risks and advanced dementia she was not started on anticoag during the admission - started on amio 100mg  tid.  - echo showed hyperdynamic LV function 80%, normal LA. No significant valve pathology   2. Dementia/Alzheimers  Past Medical History:  Diagnosis Date  . Alzheimer disease ([place])   . Closed right hip fracture ([place]) 02/29/2020   [facility] [place]  . Dementia ([place])      No Known Allergies   Current Outpatient Medications  Medication Sig Dispense Refill  . aspirin EC 325 MG EC tablet Take 1 tablet (325 mg total) by mouth daily with breakfast. 30 tablet 0  . bisacodyl (DULCOLAX) 10 MG suppository Place 1 suppository (10 mg total) rectally daily as needed for moderate constipation. 12 suppository 0  . cephALEXin (KEFLEX) 500 MG capsule Take 1 capsule (500 mg total) by mouth 2 (two) times daily. 14 capsule 0  . docusate sodium (COLACE) 100 MG capsule Take 1 capsule (100 mg total) by mouth 2 (two) times daily as needed for mild constipation. 10 capsule 0  . donepezil (ARICEPT) 10 MG tablet Take 10 mg by mouth at bedtime.    Marland Kitchen doxycycline (VIBRA-TABS) 100 MG tablet Take 1 tablet (100 mg total) by mouth 2 (two) times daily. 28 tablet 1  . feeding supplement, ENSURE ENLIVE, (ENSURE ENLIVE) LIQD Take 237 mLs by mouth 2 (two) times daily between meals. 237 mL 12  . HYDROcodone-acetaminophen (NORCO/VICODIN) 5-325 MG tablet Take 1 tablet by mouth every 6 (six) hours as needed for moderate pain. 28 tablet 0  . polyethylene glycol (MIRALAX / GLYCOLAX) packet Take 17 g by mouth daily as needed for severe constipation. 14 each 0   No current facility-administered medications for this visit.     Past Surgical History:  Procedure Laterality Date  .  INTRAMEDULLARY (IM) NAIL INTERTROCHANTERIC Left 04/18/2017   Procedure: INTRAMEDULLARY (IM) NAIL INTERTROCHANTRIC;  Surgeon: Carole Civil, MD;  Location: [facility];  Service: Orthopedics;  Laterality: Left;     No Known Allergies    No family history on file.   Social History Ms. Kellett reports that she has never smoked. She has never used smokeless tobacco. Ms. Radu reports no history of alcohol use.   Review of Systems CONSTITUTIONAL: No weight loss, fever, chills, weakness or fatigue.  HEENT: Eyes: No visual loss, blurred vision, double vision or yellow sclerae.No hearing loss, sneezing, congestion, runny nose or sore throat.  SKIN: No rash or itching.  CARDIOVASCULAR:  RESPIRATORY: No shortness of breath, cough or sputum.  GASTROINTESTINAL: No anorexia, nausea, vomiting or diarrhea. No abdominal pain or blood.  GENITOURINARY: No burning on urination, no polyuria NEUROLOGICAL: No headache, dizziness, syncope, paralysis, ataxia, numbness or tingling in the extremities. No change in bowel or bladder control.  MUSCULOSKELETAL: No muscle, back pain, joint pain or stiffness.  LYMPHATICS: No enlarged nodes. No history of splenectomy.  PSYCHIATRIC: No history of depression or anxiety.  ENDOCRINOLOGIC: No reports of sweating, cold or heat intolerance. No polyuria or polydipsia.  Marland Kitchen   Physical Examination There were no vitals filed for this visit. There were no vitals filed for this visit.  Gen: resting comfortably, no acute distress HEENT: no scleral icterus, pupils equal round and reactive, no palptable cervical adenopathy,  CV Resp:  Clear to auscultation bilaterally GI: abdomen is soft, non-tender, non-distended, normal bowel sounds, no hepatosplenomegaly MSK: extremities are warm, no edema.  Skin: warm, no rash Neuro:  no focal deficits Psych: appropriate affect   Diagnostic Studies     Assessment and Plan        Antoine Poche, M.D., F.A.C.C.

## 2020-04-25 DIAGNOSIS — Z136 Encounter for screening for cardiovascular disorders: Secondary | ICD-10-CM | POA: Diagnosis not present

## 2020-04-25 DIAGNOSIS — I4891 Unspecified atrial fibrillation: Secondary | ICD-10-CM | POA: Diagnosis not present

## 2020-05-11 DIAGNOSIS — Z0000 Encounter for general adult medical examination without abnormal findings: Secondary | ICD-10-CM | POA: Diagnosis not present

## 2020-05-11 DIAGNOSIS — E43 Unspecified severe protein-calorie malnutrition: Secondary | ICD-10-CM | POA: Diagnosis not present

## 2020-05-11 DIAGNOSIS — M25559 Pain in unspecified hip: Secondary | ICD-10-CM | POA: Diagnosis not present

## 2020-05-11 DIAGNOSIS — L02421 Furuncle of right axilla: Secondary | ICD-10-CM | POA: Diagnosis not present

## 2020-05-15 ENCOUNTER — Ambulatory Visit: Payer: Medicare Other | Admitting: Cardiology

## 2020-05-16 DIAGNOSIS — S72001D Fracture of unspecified part of neck of right femur, subsequent encounter for closed fracture with routine healing: Secondary | ICD-10-CM | POA: Diagnosis not present

## 2020-05-16 DIAGNOSIS — Z0001 Encounter for general adult medical examination with abnormal findings: Secondary | ICD-10-CM | POA: Diagnosis not present

## 2020-06-20 ENCOUNTER — Encounter: Payer: Self-pay | Admitting: *Deleted

## 2020-06-20 ENCOUNTER — Encounter: Payer: Self-pay | Admitting: Cardiology

## 2020-06-20 ENCOUNTER — Ambulatory Visit: Payer: Medicare Other | Admitting: Cardiology

## 2020-06-20 VITALS — BP 118/68 | HR 69 | Ht 61.0 in | Wt 99.0 lb

## 2020-06-20 DIAGNOSIS — I480 Paroxysmal atrial fibrillation: Secondary | ICD-10-CM | POA: Diagnosis not present

## 2020-06-20 NOTE — Patient Instructions (Signed)

## 2020-06-20 NOTE — Addendum Note (Signed)
Addended by: Lesle Chris on: 06/20/2020 02:36 PM   Modules accepted: Orders

## 2020-06-20 NOTE — Progress Notes (Signed)
Clinical Summary Heather Cline is a 84 y.o.female seen today as a new consult, referred by Dr Margo Aye for history of afib  1. Afib - noted during 04/2017 admission with hip fracture - discharge mentions afib, EKG review shows only SR with PACs -started on metoprolol, was not started on anticoag at the time.  - ongoing falls - amio stopped pcp 1 month ago  2. Severe dementia Past Medical History:  Diagnosis Date  . Alzheimer disease ([facility])   . Closed right hip fracture ([facility]) 02/29/2020   [facility] [place]  . Dementia ([facility])      No Known Allergies   Current Outpatient Medications  Medication Sig Dispense Refill  . aspirin EC 325 MG EC tablet Take 1 tablet (325 mg total) by mouth daily with breakfast. 30 tablet 0  . bisacodyl (DULCOLAX) 10 MG suppository Place 1 suppository (10 mg total) rectally daily as needed for moderate constipation. 12 suppository 0  . cephALEXin (KEFLEX) 500 MG capsule Take 1 capsule (500 mg total) by mouth 2 (two) times daily. 14 capsule 0  . docusate sodium (COLACE) 100 MG capsule Take 1 capsule (100 mg total) by mouth 2 (two) times daily as needed for mild constipation. 10 capsule 0  . donepezil (ARICEPT) 10 MG tablet Take 10 mg by mouth at bedtime.    Marland Kitchen doxycycline (VIBRA-TABS) 100 MG tablet Take 1 tablet (100 mg total) by mouth 2 (two) times daily. 28 tablet 1  . feeding supplement, ENSURE ENLIVE, (ENSURE ENLIVE) LIQD Take 237 mLs by mouth 2 (two) times daily between meals. 237 mL 12  . HYDROcodone-acetaminophen (NORCO/VICODIN) 5-325 MG tablet Take 1 tablet by mouth every 6 (six) hours as needed for moderate pain. 28 tablet 0  . polyethylene glycol (MIRALAX / GLYCOLAX) packet Take 17 g by mouth daily as needed for severe constipation. 14 each 0   No current facility-administered medications for this visit.     Past Surgical History:  Procedure Laterality Date  . INTRAMEDULLARY (IM) NAIL INTERTROCHANTERIC Left 04/18/2017   Procedure: INTRAMEDULLARY  (IM) NAIL INTERTROCHANTRIC;  Surgeon: [name], MD;  Location: [facility];  Service: Orthopedics;  Laterality: Left;     No Known Allergies    No family history on file.   Social History Heather Cline reports that she has never smoked. She has never used smokeless tobacco. Heather Cline reports no history of alcohol use.   Review of Systems CONSTITUTIONAL: No weight loss, fever, chills, weakness or fatigue.  HEENT: Eyes: No visual loss, blurred vision, double vision or yellow sclerae.No hearing loss, sneezing, congestion, runny nose or sore throat.  SKIN: No rash or itching.  CARDIOVASCULAR: per hpi RESPIRATORY: No shortness of breath, cough or sputum.  GASTROINTESTINAL: No anorexia, nausea, vomiting or diarrhea. No abdominal pain or blood.  GENITOURINARY: No burning on urination, no polyuria NEUROLOGICAL: No headache, dizziness, syncope, paralysis, ataxia, numbness or tingling in the extremities. No change in bowel or bladder control.  MUSCULOSKELETAL: No muscle, back pain, joint pain or stiffness.  LYMPHATICS: No enlarged nodes. No history of splenectomy.  PSYCHIATRIC: No history of depression or anxiety.  ENDOCRINOLOGIC: No reports of sweating, cold or heat intolerance. No polyuria or polydipsia.  Marland Kitchen   Physical Examination Today's Vitals   06/20/20 1327  BP: 118/68  Pulse: 69  Weight: 99 lb (44.9 kg)  Height: 5\' 1"  (1.549 m)   Body mass index is 18.71 kg/m.  Gen: resting comfortably, no acute distress HEENT: no scleral icterus, pupils  equal round and reactive, no palptable cervical adenopathy,  CV: irreg, no m/r/g, no jvd Resp: Clear to auscultation bilaterally GI: abdomen is soft, non-tender, non-distended, normal bowel sounds, no hepatosplenomegaly MSK: extremities are warm, no edema.  Skin: warm, no rash Neuro:  no focal deficits Psych: appropriate affect     Assessment and Plan  1. Afib - severe dementia, patient not able to report any symptoms - rate  controlled today, EKG actually looks like wandering atrial pacemaker - with severe dementia and recurrent falls agree with avoiding anticoag, continue ASA    F/u 6 months  Antoine Poche, M.D

## 2020-08-03 DIAGNOSIS — H5711 Ocular pain, right eye: Secondary | ICD-10-CM | POA: Diagnosis not present

## 2020-08-03 DIAGNOSIS — G309 Alzheimer's disease, unspecified: Secondary | ICD-10-CM | POA: Diagnosis not present

## 2020-08-05 DIAGNOSIS — H1031 Unspecified acute conjunctivitis, right eye: Secondary | ICD-10-CM | POA: Diagnosis not present

## 2020-10-13 ENCOUNTER — Other Ambulatory Visit: Payer: Self-pay

## 2020-10-13 ENCOUNTER — Emergency Department (HOSPITAL_COMMUNITY)
Admission: EM | Admit: 2020-10-13 | Discharge: 2020-10-13 | Disposition: A | Discharge status: Home / Self Care | Payer: Medicare Other | Attending: Emergency Medicine | Admitting: Emergency Medicine

## 2020-10-13 DIAGNOSIS — R410 Disorientation, unspecified: Secondary | ICD-10-CM | POA: Insufficient documentation

## 2020-10-13 DIAGNOSIS — Z5321 Procedure and treatment not carried out due to patient leaving prior to being seen by health care provider: Secondary | ICD-10-CM | POA: Insufficient documentation

## 2020-10-13 DIAGNOSIS — G309 Alzheimer's disease, unspecified: Secondary | ICD-10-CM | POA: Diagnosis not present

## 2020-10-13 NOTE — ED Triage Notes (Signed)
Pt here from home with severe alzheimer's. PCP advised to come here.   Caregiver states that for the last 3 days she hasn't been eating much and wanting to sleep. States she mumbles more than usual. Disoriented x4 at baseline. Caregiver states she has been declining. Wants increase in Aricept

## 2020-10-13 NOTE — ED Notes (Signed)
Caregiver states that she cannot stay with pt since she takes care of her husband too. Will come back if needed

## 2020-11-29 DIAGNOSIS — H938X9 Other specified disorders of ear, unspecified ear: Secondary | ICD-10-CM | POA: Diagnosis not present

## 2020-12-05 DIAGNOSIS — Z043 Encounter for examination and observation following other accident: Secondary | ICD-10-CM | POA: Diagnosis not present

## 2020-12-05 DIAGNOSIS — I4891 Unspecified atrial fibrillation: Secondary | ICD-10-CM | POA: Diagnosis not present

## 2020-12-05 DIAGNOSIS — S01111A Laceration without foreign body of right eyelid and periocular area, initial encounter: Secondary | ICD-10-CM | POA: Diagnosis not present

## 2020-12-05 DIAGNOSIS — S52101A Unspecified fracture of upper end of right radius, initial encounter for closed fracture: Secondary | ICD-10-CM | POA: Diagnosis not present

## 2020-12-05 DIAGNOSIS — S52591A Other fractures of lower end of right radius, initial encounter for closed fracture: Secondary | ICD-10-CM | POA: Diagnosis not present

## 2020-12-05 DIAGNOSIS — M7989 Other specified soft tissue disorders: Secondary | ICD-10-CM | POA: Diagnosis not present

## 2020-12-05 DIAGNOSIS — S52501A Unspecified fracture of the lower end of right radius, initial encounter for closed fracture: Secondary | ICD-10-CM | POA: Diagnosis not present

## 2020-12-05 DIAGNOSIS — S0990XA Unspecified injury of head, initial encounter: Secondary | ICD-10-CM | POA: Diagnosis not present

## 2020-12-05 DIAGNOSIS — R21 Rash and other nonspecific skin eruption: Secondary | ICD-10-CM | POA: Diagnosis not present

## 2020-12-05 DIAGNOSIS — Z87891 Personal history of nicotine dependence: Secondary | ICD-10-CM | POA: Diagnosis not present

## 2020-12-05 DIAGNOSIS — S61421A Laceration with foreign body of right hand, initial encounter: Secondary | ICD-10-CM | POA: Diagnosis not present

## 2020-12-05 DIAGNOSIS — S61212A Laceration without foreign body of right middle finger without damage to nail, initial encounter: Secondary | ICD-10-CM | POA: Diagnosis not present

## 2020-12-05 DIAGNOSIS — W19XXXA Unspecified fall, initial encounter: Secondary | ICD-10-CM | POA: Diagnosis not present

## 2020-12-05 DIAGNOSIS — W010XXA Fall on same level from slipping, tripping and stumbling without subsequent striking against object, initial encounter: Secondary | ICD-10-CM | POA: Diagnosis not present

## 2020-12-05 DIAGNOSIS — G309 Alzheimer's disease, unspecified: Secondary | ICD-10-CM | POA: Diagnosis not present

## 2020-12-07 DIAGNOSIS — S62101S Fracture of unspecified carpal bone, right wrist, sequela: Secondary | ICD-10-CM | POA: Diagnosis not present

## 2020-12-07 DIAGNOSIS — L819 Disorder of pigmentation, unspecified: Secondary | ICD-10-CM | POA: Diagnosis not present

## 2020-12-07 DIAGNOSIS — R230 Cyanosis: Secondary | ICD-10-CM | POA: Diagnosis not present

## 2020-12-11 DIAGNOSIS — S5001XA Contusion of right elbow, initial encounter: Secondary | ICD-10-CM | POA: Diagnosis not present

## 2020-12-11 DIAGNOSIS — S52551A Other extraarticular fracture of lower end of right radius, initial encounter for closed fracture: Secondary | ICD-10-CM | POA: Diagnosis not present

## 2020-12-11 DIAGNOSIS — M25531 Pain in right wrist: Secondary | ICD-10-CM | POA: Diagnosis not present

## 2020-12-12 DIAGNOSIS — H938X9 Other specified disorders of ear, unspecified ear: Secondary | ICD-10-CM | POA: Diagnosis not present

## 2020-12-12 DIAGNOSIS — S52009D Unspecified fracture of upper end of unspecified ulna, subsequent encounter for closed fracture with routine healing: Secondary | ICD-10-CM | POA: Diagnosis not present

## 2020-12-12 DIAGNOSIS — G309 Alzheimer's disease, unspecified: Secondary | ICD-10-CM | POA: Diagnosis not present

## 2020-12-12 DIAGNOSIS — Z4802 Encounter for removal of sutures: Secondary | ICD-10-CM | POA: Diagnosis not present

## 2020-12-12 DIAGNOSIS — E43 Unspecified severe protein-calorie malnutrition: Secondary | ICD-10-CM | POA: Diagnosis not present

## 2020-12-20 DIAGNOSIS — S52551A Other extraarticular fracture of lower end of right radius, initial encounter for closed fracture: Secondary | ICD-10-CM | POA: Diagnosis not present

## 2020-12-22 ENCOUNTER — Ambulatory Visit: Payer: Medicare Other | Admitting: Cardiology

## 2020-12-22 NOTE — Progress Notes (Deleted)
Clinical Summary Heather Cline is a 85 y.o.female seen today as a new consult, referred by Dr Margo Aye for history of afib  1. Afib - noted during 04/2017 admission with hip fracture and ORIF - discharge mentions afib, EKG review shows only SR with PACs -started on metoprolol, was not started on anticoag at the time.  - ongoing falls - amio stopped pcp 1 month ago  2. Severe dementia   Past Medical History:  Diagnosis Date  . Alzheimer disease ([facility])   . Closed right hip fracture ([facility]) 02/29/2020   [facility] [place]  . Dementia ([facility])      No Known Allergies   Current Outpatient Medications  Medication Sig Dispense Refill  . aspirin EC 325 MG EC tablet Take 1 tablet (325 mg total) by mouth daily with breakfast. 30 tablet 0  . docusate sodium (COLACE) 100 MG capsule Take 1 capsule (100 mg total) by mouth 2 (two) times daily as needed for mild constipation. 10 capsule 0  . donepezil (ARICEPT) 10 MG tablet Take 10 mg by mouth at bedtime.    . feeding supplement, ENSURE ENLIVE, (ENSURE ENLIVE) LIQD Take 237 mLs by mouth 2 (two) times daily between meals. 237 mL 12  . metoprolol succinate (TOPROL-XL) 25 MG 24 hr tablet Take 12.5 mg by mouth daily.     No current facility-administered medications for this visit.     Past Surgical History:  Procedure Laterality Date  . INTRAMEDULLARY (IM) NAIL INTERTROCHANTERIC Left 04/18/2017   Procedure: INTRAMEDULLARY (IM) NAIL INTERTROCHANTRIC;  Surgeon: [name], MD;  Location: [facility];  Service: Orthopedics;  Laterality: Left;     No Known Allergies    Family History  Problem Relation Age of Onset  . Alzheimer's disease Mother      Social History Ms. Iezzi reports that she has never smoked. She has never used smokeless tobacco. Ms. Galindez reports no history of alcohol use.   Review of Systems CONSTITUTIONAL: No weight loss, fever, chills, weakness or fatigue.  HEENT: Eyes: No visual loss, blurred vision, double  vision or yellow sclerae.No hearing loss, sneezing, congestion, runny nose or sore throat.  SKIN: No rash or itching.  CARDIOVASCULAR:  RESPIRATORY: No shortness of breath, cough or sputum.  GASTROINTESTINAL: No anorexia, nausea, vomiting or diarrhea. No abdominal pain or blood.  GENITOURINARY: No burning on urination, no polyuria NEUROLOGICAL: No headache, dizziness, syncope, paralysis, ataxia, numbness or tingling in the extremities. No change in bowel or bladder control.  MUSCULOSKELETAL: No muscle, back pain, joint pain or stiffness.  LYMPHATICS: No enlarged nodes. No history of splenectomy.  PSYCHIATRIC: No history of depression or anxiety.  ENDOCRINOLOGIC: No reports of sweating, cold or heat intolerance. No polyuria or polydipsia.  Marland Kitchen   Physical Examination There were no vitals filed for this visit. There were no vitals filed for this visit.  Gen: resting comfortably, no acute distress HEENT: no scleral icterus, pupils equal round and reactive, no palptable cervical adenopathy,  CV Resp: Clear to auscultation bilaterally GI: abdomen is soft, non-tender, non-distended, normal bowel sounds, no hepatosplenomegaly MSK: extremities are warm, no edema.  Skin: warm, no rash Neuro:  no focal deficits Psych: appropriate affect   Diagnostic Studies     Assessment and Plan  1. Afib - severe dementia, patient not able to report any symptoms - rate controlled today, EKG actually looks like wandering atrial pacemaker - with severe dementia and recurrent falls agree with avoiding anticoag, continue ASA  Arnoldo Lenis, M.D., F.A.C.C.

## 2021-01-03 DIAGNOSIS — S52551D Other extraarticular fracture of lower end of right radius, subsequent encounter for closed fracture with routine healing: Secondary | ICD-10-CM | POA: Diagnosis not present

## 2021-02-05 DIAGNOSIS — M6281 Muscle weakness (generalized): Secondary | ICD-10-CM | POA: Diagnosis not present

## 2021-02-05 DIAGNOSIS — M79603 Pain in arm, unspecified: Secondary | ICD-10-CM | POA: Diagnosis not present

## 2021-02-05 DIAGNOSIS — Z9181 History of falling: Secondary | ICD-10-CM | POA: Diagnosis not present

## 2021-02-05 DIAGNOSIS — S52551A Other extraarticular fracture of lower end of right radius, initial encounter for closed fracture: Secondary | ICD-10-CM | POA: Diagnosis not present

## 2021-02-08 DIAGNOSIS — M79603 Pain in arm, unspecified: Secondary | ICD-10-CM | POA: Diagnosis not present

## 2021-02-08 DIAGNOSIS — M6281 Muscle weakness (generalized): Secondary | ICD-10-CM | POA: Diagnosis not present

## 2021-02-08 DIAGNOSIS — Z9181 History of falling: Secondary | ICD-10-CM | POA: Diagnosis not present

## 2021-02-08 DIAGNOSIS — S52551A Other extraarticular fracture of lower end of right radius, initial encounter for closed fracture: Secondary | ICD-10-CM | POA: Diagnosis not present

## 2021-02-21 DIAGNOSIS — Z9181 History of falling: Secondary | ICD-10-CM | POA: Diagnosis not present

## 2021-02-21 DIAGNOSIS — M6281 Muscle weakness (generalized): Secondary | ICD-10-CM | POA: Diagnosis not present

## 2021-02-21 DIAGNOSIS — M79603 Pain in arm, unspecified: Secondary | ICD-10-CM | POA: Diagnosis not present

## 2021-02-21 DIAGNOSIS — S52551A Other extraarticular fracture of lower end of right radius, initial encounter for closed fracture: Secondary | ICD-10-CM | POA: Diagnosis not present

## 2021-02-26 DIAGNOSIS — S52551D Other extraarticular fracture of lower end of right radius, subsequent encounter for closed fracture with routine healing: Secondary | ICD-10-CM | POA: Diagnosis not present

## 2021-02-27 DIAGNOSIS — S52551A Other extraarticular fracture of lower end of right radius, initial encounter for closed fracture: Secondary | ICD-10-CM | POA: Diagnosis not present

## 2021-02-27 DIAGNOSIS — M79603 Pain in arm, unspecified: Secondary | ICD-10-CM | POA: Diagnosis not present

## 2021-02-27 DIAGNOSIS — M6281 Muscle weakness (generalized): Secondary | ICD-10-CM | POA: Diagnosis not present

## 2021-02-27 DIAGNOSIS — Z9181 History of falling: Secondary | ICD-10-CM | POA: Diagnosis not present

## 2021-03-04 DIAGNOSIS — M6281 Muscle weakness (generalized): Secondary | ICD-10-CM | POA: Diagnosis not present

## 2021-03-04 DIAGNOSIS — M79603 Pain in arm, unspecified: Secondary | ICD-10-CM | POA: Diagnosis not present

## 2021-03-04 DIAGNOSIS — S52551A Other extraarticular fracture of lower end of right radius, initial encounter for closed fracture: Secondary | ICD-10-CM | POA: Diagnosis not present

## 2021-03-04 DIAGNOSIS — Z9181 History of falling: Secondary | ICD-10-CM | POA: Diagnosis not present

## 2021-03-09 DIAGNOSIS — Z7982 Long term (current) use of aspirin: Secondary | ICD-10-CM | POA: Diagnosis not present

## 2021-03-09 DIAGNOSIS — Z79899 Other long term (current) drug therapy: Secondary | ICD-10-CM | POA: Diagnosis not present

## 2021-03-09 DIAGNOSIS — S0990XA Unspecified injury of head, initial encounter: Secondary | ICD-10-CM | POA: Diagnosis not present

## 2021-03-09 DIAGNOSIS — W19XXXA Unspecified fall, initial encounter: Secondary | ICD-10-CM | POA: Diagnosis not present

## 2021-03-09 DIAGNOSIS — S0093XA Contusion of unspecified part of head, initial encounter: Secondary | ICD-10-CM | POA: Diagnosis not present

## 2021-03-09 DIAGNOSIS — I440 Atrioventricular block, first degree: Secondary | ICD-10-CM | POA: Diagnosis not present

## 2021-03-09 DIAGNOSIS — W07XXXA Fall from chair, initial encounter: Secondary | ICD-10-CM | POA: Diagnosis not present

## 2021-03-09 DIAGNOSIS — G309 Alzheimer's disease, unspecified: Secondary | ICD-10-CM | POA: Diagnosis not present

## 2021-03-09 DIAGNOSIS — S0003XA Contusion of scalp, initial encounter: Secondary | ICD-10-CM | POA: Diagnosis not present

## 2021-03-17 DIAGNOSIS — R051 Acute cough: Secondary | ICD-10-CM | POA: Diagnosis not present

## 2021-03-17 DIAGNOSIS — J019 Acute sinusitis, unspecified: Secondary | ICD-10-CM | POA: Diagnosis not present

## 2021-03-19 DIAGNOSIS — S2020XA Contusion of thorax, unspecified, initial encounter: Secondary | ICD-10-CM | POA: Diagnosis not present

## 2021-03-19 DIAGNOSIS — S0003XA Contusion of scalp, initial encounter: Secondary | ICD-10-CM | POA: Diagnosis not present

## 2021-03-19 DIAGNOSIS — G319 Degenerative disease of nervous system, unspecified: Secondary | ICD-10-CM | POA: Diagnosis not present

## 2021-03-19 DIAGNOSIS — Z96643 Presence of artificial hip joint, bilateral: Secondary | ICD-10-CM | POA: Diagnosis not present

## 2021-03-19 DIAGNOSIS — W01198A Fall on same level from slipping, tripping and stumbling with subsequent striking against other object, initial encounter: Secondary | ICD-10-CM | POA: Diagnosis not present

## 2021-03-19 DIAGNOSIS — G309 Alzheimer's disease, unspecified: Secondary | ICD-10-CM | POA: Diagnosis not present

## 2021-03-19 DIAGNOSIS — S0990XA Unspecified injury of head, initial encounter: Secondary | ICD-10-CM | POA: Diagnosis not present

## 2021-03-19 DIAGNOSIS — M25551 Pain in right hip: Secondary | ICD-10-CM | POA: Diagnosis not present

## 2021-04-18 DIAGNOSIS — Z8781 Personal history of (healed) traumatic fracture: Secondary | ICD-10-CM | POA: Diagnosis not present

## 2021-04-18 DIAGNOSIS — M25552 Pain in left hip: Secondary | ICD-10-CM | POA: Diagnosis not present

## 2021-04-18 DIAGNOSIS — W19XXXA Unspecified fall, initial encounter: Secondary | ICD-10-CM | POA: Diagnosis not present

## 2021-05-11 DIAGNOSIS — S72142A Displaced intertrochanteric fracture of left femur, initial encounter for closed fracture: Secondary | ICD-10-CM | POA: Diagnosis not present

## 2021-05-11 DIAGNOSIS — Z8781 Personal history of (healed) traumatic fracture: Secondary | ICD-10-CM | POA: Diagnosis not present

## 2021-05-11 DIAGNOSIS — M25552 Pain in left hip: Secondary | ICD-10-CM | POA: Diagnosis not present

## 2021-05-11 DIAGNOSIS — M1612 Unilateral primary osteoarthritis, left hip: Secondary | ICD-10-CM | POA: Diagnosis not present

## 2021-05-11 DIAGNOSIS — Z9889 Other specified postprocedural states: Secondary | ICD-10-CM | POA: Diagnosis not present

## 2021-07-12 DIAGNOSIS — L89229 Pressure ulcer of left hip, unspecified stage: Secondary | ICD-10-CM | POA: Diagnosis not present

## 2021-07-12 DIAGNOSIS — Z9181 History of falling: Secondary | ICD-10-CM | POA: Diagnosis not present

## 2021-07-12 DIAGNOSIS — Z8781 Personal history of (healed) traumatic fracture: Secondary | ICD-10-CM | POA: Diagnosis not present

## 2021-07-12 DIAGNOSIS — R2681 Unsteadiness on feet: Secondary | ICD-10-CM | POA: Diagnosis not present

## 2021-07-12 DIAGNOSIS — M25552 Pain in left hip: Secondary | ICD-10-CM | POA: Diagnosis not present

## 2021-07-26 DIAGNOSIS — L89229 Pressure ulcer of left hip, unspecified stage: Secondary | ICD-10-CM | POA: Diagnosis not present

## 2021-08-01 DIAGNOSIS — T148XXA Other injury of unspecified body region, initial encounter: Secondary | ICD-10-CM | POA: Diagnosis not present

## 2021-08-01 DIAGNOSIS — W19XXXA Unspecified fall, initial encounter: Secondary | ICD-10-CM | POA: Diagnosis not present

## 2021-08-08 DIAGNOSIS — I6613 Occlusion and stenosis of bilateral anterior cerebral arteries: Secondary | ICD-10-CM | POA: Diagnosis not present

## 2021-08-08 DIAGNOSIS — Z7982 Long term (current) use of aspirin: Secondary | ICD-10-CM | POA: Diagnosis not present

## 2021-08-08 DIAGNOSIS — E440 Moderate protein-calorie malnutrition: Secondary | ICD-10-CM | POA: Diagnosis not present

## 2021-08-08 DIAGNOSIS — R404 Transient alteration of awareness: Secondary | ICD-10-CM | POA: Diagnosis not present

## 2021-08-08 DIAGNOSIS — Z743 Need for continuous supervision: Secondary | ICD-10-CM | POA: Diagnosis not present

## 2021-08-08 DIAGNOSIS — F02C0 Dementia in other diseases classified elsewhere, severe, without behavioral disturbance, psychotic disturbance, mood disturbance, and anxiety: Secondary | ICD-10-CM | POA: Diagnosis not present

## 2021-08-08 DIAGNOSIS — Z8673 Personal history of transient ischemic attack (TIA), and cerebral infarction without residual deficits: Secondary | ICD-10-CM | POA: Diagnosis not present

## 2021-08-08 DIAGNOSIS — Z7401 Bed confinement status: Secondary | ICD-10-CM | POA: Diagnosis not present

## 2021-08-08 DIAGNOSIS — R296 Repeated falls: Secondary | ICD-10-CM | POA: Diagnosis not present

## 2021-08-08 DIAGNOSIS — G309 Alzheimer's disease, unspecified: Secondary | ICD-10-CM | POA: Diagnosis not present

## 2021-08-08 DIAGNOSIS — Z20822 Contact with and (suspected) exposure to covid-19: Secondary | ICD-10-CM | POA: Diagnosis not present

## 2021-08-08 DIAGNOSIS — I639 Cerebral infarction, unspecified: Secondary | ICD-10-CM | POA: Diagnosis not present

## 2021-08-08 DIAGNOSIS — R6889 Other general symptoms and signs: Secondary | ICD-10-CM | POA: Diagnosis not present

## 2021-08-08 DIAGNOSIS — N183 Chronic kidney disease, stage 3 unspecified: Secondary | ICD-10-CM | POA: Diagnosis not present

## 2021-08-08 DIAGNOSIS — I63412 Cerebral infarction due to embolism of left middle cerebral artery: Secondary | ICD-10-CM | POA: Diagnosis not present

## 2021-08-08 DIAGNOSIS — Z66 Do not resuscitate: Secondary | ICD-10-CM | POA: Diagnosis not present

## 2021-08-08 DIAGNOSIS — I4891 Unspecified atrial fibrillation: Secondary | ICD-10-CM | POA: Diagnosis not present

## 2021-08-08 DIAGNOSIS — I480 Paroxysmal atrial fibrillation: Secondary | ICD-10-CM | POA: Diagnosis not present

## 2021-08-08 DIAGNOSIS — Z515 Encounter for palliative care: Secondary | ICD-10-CM | POA: Diagnosis not present

## 2021-08-08 DIAGNOSIS — I6623 Occlusion and stenosis of bilateral posterior cerebral arteries: Secondary | ICD-10-CM | POA: Diagnosis not present

## 2021-08-08 DIAGNOSIS — Z79899 Other long term (current) drug therapy: Secondary | ICD-10-CM | POA: Diagnosis not present

## 2021-08-08 DIAGNOSIS — I6602 Occlusion and stenosis of left middle cerebral artery: Secondary | ICD-10-CM | POA: Diagnosis not present

## 2021-08-08 DIAGNOSIS — J690 Pneumonitis due to inhalation of food and vomit: Secondary | ICD-10-CM | POA: Diagnosis not present

## 2021-08-08 DIAGNOSIS — R29715 NIHSS score 15: Secondary | ICD-10-CM | POA: Diagnosis not present

## 2021-08-08 DIAGNOSIS — R0600 Dyspnea, unspecified: Secondary | ICD-10-CM | POA: Diagnosis not present

## 2021-08-08 DIAGNOSIS — I63512 Cerebral infarction due to unspecified occlusion or stenosis of left middle cerebral artery: Secondary | ICD-10-CM | POA: Diagnosis not present

## 2021-12-05 DEATH — deceased
